# Patient Record
Sex: Female | Born: 1970
Health system: Southern US, Community
[De-identification: ages and names within clinical notes are randomized; demographics above are authoritative.]

## PROBLEM LIST (undated history)

## (undated) DIAGNOSIS — M5137 Other intervertebral disc degeneration, lumbosacral region: Secondary | ICD-10-CM

## (undated) DIAGNOSIS — R55 Syncope and collapse: Secondary | ICD-10-CM

## (undated) DIAGNOSIS — I10 Essential (primary) hypertension: Secondary | ICD-10-CM

## (undated) DIAGNOSIS — D649 Anemia, unspecified: Secondary | ICD-10-CM

## (undated) DIAGNOSIS — M549 Dorsalgia, unspecified: Secondary | ICD-10-CM

## (undated) DIAGNOSIS — M5030 Other cervical disc degeneration, unspecified cervical region: Secondary | ICD-10-CM

## (undated) DIAGNOSIS — M1990 Unspecified osteoarthritis, unspecified site: Secondary | ICD-10-CM

## (undated) DIAGNOSIS — G8929 Other chronic pain: Secondary | ICD-10-CM

## (undated) DIAGNOSIS — K219 Gastro-esophageal reflux disease without esophagitis: Secondary | ICD-10-CM

## (undated) DIAGNOSIS — N390 Urinary tract infection, site not specified: Secondary | ICD-10-CM

## (undated) DIAGNOSIS — IMO0001 Reserved for inherently not codable concepts without codable children: Secondary | ICD-10-CM

## (undated) DIAGNOSIS — Z9889 Other specified postprocedural states: Secondary | ICD-10-CM

## (undated) DIAGNOSIS — J45909 Unspecified asthma, uncomplicated: Secondary | ICD-10-CM

## (undated) DIAGNOSIS — T8859XA Other complications of anesthesia, initial encounter: Secondary | ICD-10-CM

## (undated) DIAGNOSIS — N200 Calculus of kidney: Secondary | ICD-10-CM

## (undated) DIAGNOSIS — R112 Nausea with vomiting, unspecified: Secondary | ICD-10-CM

## (undated) DIAGNOSIS — T4145XA Adverse effect of unspecified anesthetic, initial encounter: Secondary | ICD-10-CM

## (undated) DIAGNOSIS — E7800 Pure hypercholesterolemia, unspecified: Secondary | ICD-10-CM

## (undated) DIAGNOSIS — J189 Pneumonia, unspecified organism: Secondary | ICD-10-CM

## (undated) DIAGNOSIS — M51379 Other intervertebral disc degeneration, lumbosacral region without mention of lumbar back pain or lower extremity pain: Secondary | ICD-10-CM

## (undated) DIAGNOSIS — G43909 Migraine, unspecified, not intractable, without status migrainosus: Secondary | ICD-10-CM

## (undated) DIAGNOSIS — J42 Unspecified chronic bronchitis: Secondary | ICD-10-CM

## (undated) HISTORY — DX: Anemia, unspecified: D64.9

## (undated) HISTORY — PX: KNEE ARTHROSCOPY: SHX127

## (undated) HISTORY — DX: Calculus of kidney: N20.0

## (undated) HISTORY — PX: BACK SURGERY: SHX140

## (undated) HISTORY — DX: Gastro-esophageal reflux disease without esophagitis: K21.9

## (undated) HISTORY — DX: Unspecified osteoarthritis, unspecified site: M19.90

---

## 1999-02-21 HISTORY — PX: CARPAL TUNNEL RELEASE: SHX101

## 1999-02-21 HISTORY — PX: GANGLION CYST EXCISION: SHX1691

## 2008-06-22 HISTORY — PX: LUMBAR LAMINECTOMY/DECOMPRESSION MICRODISCECTOMY: SHX5026

## 2008-12-13 ENCOUNTER — Ambulatory Visit (HOSPITAL_COMMUNITY): Admission: RE | Admit: 2008-12-13 | Discharge: 2008-12-16 | Payer: Self-pay | Admitting: Specialist

## 2008-12-13 ENCOUNTER — Encounter (INDEPENDENT_AMBULATORY_CARE_PROVIDER_SITE_OTHER): Payer: Self-pay | Admitting: Specialist

## 2010-09-29 LAB — CBC
HCT: 34.4 % — ABNORMAL LOW (ref 36.0–46.0)
MCHC: 33.8 g/dL (ref 30.0–36.0)
MCV: 86.9 fL (ref 78.0–100.0)
Platelets: 306 10*3/uL (ref 150–400)
Platelets: 327 10*3/uL (ref 150–400)
RBC: 3.93 MIL/uL (ref 3.87–5.11)
RBC: 3.96 MIL/uL (ref 3.87–5.11)
WBC: 7.1 10*3/uL (ref 4.0–10.5)
WBC: 8 10*3/uL (ref 4.0–10.5)

## 2010-09-29 LAB — URINALYSIS, ROUTINE W REFLEX MICROSCOPIC
Bilirubin Urine: NEGATIVE
Glucose, UA: NEGATIVE mg/dL
Hgb urine dipstick: NEGATIVE
Protein, ur: NEGATIVE mg/dL
Urobilinogen, UA: 0.2 mg/dL (ref 0.0–1.0)

## 2010-09-29 LAB — APTT: aPTT: 30 seconds (ref 24–37)

## 2010-09-29 LAB — PROTIME-INR: INR: 1 (ref 0.00–1.49)

## 2010-09-29 LAB — BASIC METABOLIC PANEL
BUN: 8 mg/dL (ref 6–23)
Calcium: 8.5 mg/dL (ref 8.4–10.5)
Creatinine, Ser: 0.83 mg/dL (ref 0.4–1.2)
GFR calc Af Amer: >60 mL/min (ref >60)
GFR calc non Af Amer: >60 mL/min (ref >60)

## 2010-09-29 LAB — COMPREHENSIVE METABOLIC PANEL
BUN: 13 mg/dL (ref 6–23)
CO2: 29 mEq/L (ref 19–32)
Calcium: 8.8 mg/dL (ref 8.4–10.5)
Chloride: 105 mEq/L (ref 96–112)
Creatinine, Ser: 0.97 mg/dL (ref 0.4–1.2)
GFR calc Af Amer: >60 mL/min (ref >60)
GFR calc non Af Amer: >60 mL/min (ref >60)
Total Bilirubin: 0.4 mg/dL (ref 0.3–1.2)

## 2010-11-04 NOTE — Op Note (Signed)
NAMEHAILLIE, RADU               ACCOUNT NO.:  [id number]   MEDICAL RECORD NO.:  [id number]          PATIENT TYPE:  OIB   LOCATION:  1611                         FACILITY:  [facility]   PHYSICIAN:  Jene Every, M.D.    DATE OF BIRTH:  1970-08-27   DATE OF PROCEDURE:  12/13/2008  DATE OF DISCHARGE:                               OPERATIVE REPORT   PREOPERATIVE DIAGNOSES:  Herniated nucleus pulposus at L4-5 right and L5-  1 left.   POSTOPERATIVE DIAGNOSES:  Herniated nucleus pulposus at L4-5 right and  L5-1 left.   PROCEDURE PERFORMED:  Lumbar decompression L4-5 right and L5-S1 left.   ANESTHESIA:  General.   ASSISTANT:  Roma Schanz, P.A.   BRIEF HISTORY AND INDICATIONS:  A 40 year old with HNP 4-5 paracentral  to the right with foraminal disk herniation compressing the 5 root.  Disk herniation L5-S1 to the left compressing the S1 nerve root with S1  radiculopathy.  Preoperatively, she had positive tension signs on the  right, EHL weakness at 4+/5, decreased sensation L5 dermatome on the  left, positive straight leg raise, diminished repetitive plantar flexion  __________ sensation lateral aspect of the foot.  She was indicated for  decompression of 4-5 on the right and at 5 on the left.  The risks and  benefits discussed, including bleeding, infection, damage to  neurovascular structures, CSF leakage, epidural fibrosis, __________need  for fusion in the future, anesthetic complications, etc.   TECHNIQUE:  Patient in supine position after the induction of adequate  general anesthesia and 2 grams of Kefzol, she was placed prone on the  Andrews frame and all bony prominences well padded.  The lumbar region  was prepped and draped in the usual sterile fashion.  Two 18 gauge  spinal needles were utilized to localize the 4-5 and 5-1 interspace.  This was confirmed with x-ray.  An incision was made from the spinous  process of 4-S1.  Subcutaneous tissue was dissected.   Electrocautery was  utilized to achieve hemostasis.  The dorsal lumbar fascia identified and  divided in line with the skin incision first and left at 4-5.  McCullough retractor was placed.  A Penfield IV in the interlaminar  space confirming the space at 4-5.  The operating microscope was draped  and brought onto the surgical field.  First, ligamentum flavum was  detached from the cephalad edge of 5 utilizing a straight curette and  from the caudad edge of 4.  Neuro patty placed beneath the ligamentum  flavum.  Foraminotomy of 5 was performed.  Ligamentum flavum removed  from the interspace.  The 5 root was noted to be compressed in the  lateral recess.  The lateral recess was decompressed at the medial  border of the pedicle.  I gently mobilized the 5 root medially.  HNP was  noted at 4-5.  After foraminotomy of 5 was performed, annulotomy was  performed and a copious portion of disk material was removed from the  disk space with straight and upbiting pituitary and mobilized out  laterally from the foramen as well.  The disk herniation extended  down  into the foramen of 5 and this was retrieved with a micro pituitary with  a small fragment here.  This decompressed the 5 root.  Following this,  there was at least a cm of excursion of 5 root pedicle without  difficulty.  I checked beneath the thecal sac, axilla and the shoulder  of the root and foramen of 4 and down to 5 and there was no residual  compression upon the 5 root.  The disk space copiously irrigated with  antibiotic irrigation.  Dissection revealed no evidence of CSF leakage  of active bleeding.  We placed thrombin soaked Gelfoam into the defect.  Next, we turned attention to 5-1 on the right.  In a similar fashion, I  incised the dorsolumbar fascia.  The paraspinal muscles elevated from  the lamina of 5 and 1.  A McCullough retractor was placed which  confirmed the space at 5-1 with a Penfield IV in the interlaminar space   and palpation noted the sacrum.  Ligamentum flavum detached from the  cephalad edge of S1.  Utilizing straight curette, foraminotomy of S1 was  performed.  Ligamentum flavum removed from the inner space.  This  protecting the nerve root, it was significantly decompressing the  lateral recess secondary to stenosis, ligamentum flavum hypertrophy in  the disk protrusion.  Gentle mobilized medially and decompressed the  lateral recess and medial border of the pedicle.  A small focal disk  herniation was performed and an annulotomy was performed and copious  portion of disk material was removed from the disk space with straight  and upbiting pituitary.  A full diskectomy of herniated material was  performed.  I then checked the foramen of 5 and S1, widely patent.  Beneath the thecal sac, the axilla and the shoulder root with no  residual compression upon the root.  The disk space was copiously  irrigated with antibiotic irrigation.  Inspection revealed no evidence  CSF leakage of active bleeding.  McCullough retractor was removed on  both.  Irrigation, no active bleeding.  We repaired the dorsolumbar  fascia around both incisions with #1 Vicryl interrupted figure-of-eight  stitches.  The subcutaneous tissue reapproximated with 2-0 Vicryl simple  sutures.  The skin was reapproximated with 4-0 subcuticular Prolene.  The wound reinforced with Steri-Strips.  Sterile dressing applied.  Placed supine on the hospital bed, extubated without difficulty and  transported to the recovery room in satisfactory condition.   The patient tolerated the procedure well with no complications.      Jene Every, M.D.  Electronically Signed     JB/MEDQ  D:  12/31/2008  T:  01/01/2009  Job:  [id number]

## 2010-12-16 ENCOUNTER — Other Ambulatory Visit (HOSPITAL_COMMUNITY): Payer: Self-pay | Admitting: Specialist

## 2010-12-16 ENCOUNTER — Other Ambulatory Visit: Payer: Self-pay | Admitting: Specialist

## 2010-12-16 ENCOUNTER — Ambulatory Visit (HOSPITAL_COMMUNITY)
Admission: RE | Admit: 2010-12-16 | Discharge: 2010-12-16 | Disposition: A | Discharge status: Home / Self Care | Payer: PRIVATE HEALTH INSURANCE | Source: Ambulatory Visit | Attending: Specialist | Admitting: Specialist

## 2010-12-16 ENCOUNTER — Encounter (HOSPITAL_COMMUNITY): Payer: PRIVATE HEALTH INSURANCE

## 2010-12-16 DIAGNOSIS — M7500 Adhesive capsulitis of unspecified shoulder: Secondary | ICD-10-CM | POA: Insufficient documentation

## 2010-12-16 DIAGNOSIS — Z01811 Encounter for preprocedural respiratory examination: Secondary | ICD-10-CM | POA: Insufficient documentation

## 2010-12-16 DIAGNOSIS — M25819 Other specified joint disorders, unspecified shoulder: Secondary | ICD-10-CM | POA: Insufficient documentation

## 2010-12-16 DIAGNOSIS — M75101 Unspecified rotator cuff tear or rupture of right shoulder, not specified as traumatic: Secondary | ICD-10-CM

## 2010-12-16 DIAGNOSIS — Z01810 Encounter for preprocedural cardiovascular examination: Secondary | ICD-10-CM | POA: Insufficient documentation

## 2010-12-16 DIAGNOSIS — Z79899 Other long term (current) drug therapy: Secondary | ICD-10-CM | POA: Insufficient documentation

## 2010-12-16 DIAGNOSIS — X58XXXA Exposure to other specified factors, initial encounter: Secondary | ICD-10-CM | POA: Insufficient documentation

## 2010-12-16 DIAGNOSIS — Z01812 Encounter for preprocedural laboratory examination: Secondary | ICD-10-CM | POA: Insufficient documentation

## 2010-12-16 DIAGNOSIS — S43429A Sprain of unspecified rotator cuff capsule, initial encounter: Secondary | ICD-10-CM | POA: Insufficient documentation

## 2010-12-16 DIAGNOSIS — J45909 Unspecified asthma, uncomplicated: Secondary | ICD-10-CM | POA: Insufficient documentation

## 2010-12-16 LAB — CBC
HCT: 40.6 % (ref 36.0–46.0)
Hemoglobin: 13.2 g/dL (ref 12.0–15.0)
RBC: 4.86 MIL/uL (ref 3.87–5.11)
RDW: 13.6 % (ref 11.5–15.5)
WBC: 9.4 10*3/uL (ref 4.0–10.5)

## 2010-12-16 LAB — BASIC METABOLIC PANEL
BUN: 19 mg/dL (ref 6–23)
CO2: 27 mEq/L (ref 19–32)
Chloride: 99 mEq/L (ref 96–112)
GFR calc Af Amer: >60 mL/min (ref >60)
Potassium: 4.2 mEq/L (ref 3.5–5.1)

## 2010-12-21 HISTORY — PX: SHOULDER ARTHROSCOPY W/ ROTATOR CUFF REPAIR: SHX2400

## 2010-12-25 ENCOUNTER — Ambulatory Visit (HOSPITAL_COMMUNITY)
Admission: RE | Admit: 2010-12-25 | Discharge: 2010-12-26 | Disposition: A | Discharge status: Home / Self Care | Payer: PRIVATE HEALTH INSURANCE | Source: Ambulatory Visit | Attending: Specialist | Admitting: Specialist

## 2010-12-25 DIAGNOSIS — S43429A Sprain of unspecified rotator cuff capsule, initial encounter: Secondary | ICD-10-CM | POA: Insufficient documentation

## 2010-12-25 DIAGNOSIS — M25819 Other specified joint disorders, unspecified shoulder: Secondary | ICD-10-CM | POA: Insufficient documentation

## 2010-12-25 DIAGNOSIS — M659 Unspecified synovitis and tenosynovitis, unspecified site: Secondary | ICD-10-CM | POA: Insufficient documentation

## 2010-12-25 DIAGNOSIS — F329 Major depressive disorder, single episode, unspecified: Secondary | ICD-10-CM | POA: Insufficient documentation

## 2010-12-25 DIAGNOSIS — F3289 Other specified depressive episodes: Secondary | ICD-10-CM | POA: Insufficient documentation

## 2010-12-25 DIAGNOSIS — M7500 Adhesive capsulitis of unspecified shoulder: Secondary | ICD-10-CM | POA: Insufficient documentation

## 2010-12-25 DIAGNOSIS — Z01812 Encounter for preprocedural laboratory examination: Secondary | ICD-10-CM | POA: Insufficient documentation

## 2010-12-25 DIAGNOSIS — Z87891 Personal history of nicotine dependence: Secondary | ICD-10-CM | POA: Insufficient documentation

## 2010-12-25 DIAGNOSIS — J45909 Unspecified asthma, uncomplicated: Secondary | ICD-10-CM | POA: Insufficient documentation

## 2010-12-25 DIAGNOSIS — X58XXXA Exposure to other specified factors, initial encounter: Secondary | ICD-10-CM | POA: Insufficient documentation

## 2010-12-25 LAB — HCG, SERUM, QUALITATIVE: Preg, Serum: NEGATIVE

## 2010-12-29 NOTE — Op Note (Signed)
Jordan Kim, Jordan Kim               ACCOUNT NO.:  [id number]  MEDICAL RECORD NO.:  [id number]  LOCATION:  DAYL                         FACILITY:  [facility]  PHYSICIAN:  Jene Every, M.D.    DATE OF BIRTH:  [birth year]  DATE OF PROCEDURE: DATE OF DISCHARGE:                              OPERATIVE REPORT   PREOPERATIVE DIAGNOSIS:  Rotator cuff tear, impingement syndrome, adhesive capsulitis, right shoulder.  POSTOPERATIVE DIAGNOSIS:  Rotator cuff tear, impingement syndrome, adhesive capsulitis, right shoulder.  PROCEDURE PERFORMED: 1. Exam and manipulation under anesthesia. 2. Right shoulder arthroscopy. 3. Debridement of bicipital tendonitis and rotator cuff and labrum. 4. Subacromial decompression, bursectomy, mini open rotator cuff     repair.  ANESTHESIA:  General.  ASSISTANT:  Roma Schanz, P.A.  BRIEF HISTORY:  This 40 year old with locking shoulder pain, refractory conservative treatment including corticosteroid injections, activity modifications and restricted range of motion was indicated for exam and manipulation under anesthesia, shoulder arthroscopy, debridement and probable mini open rotator cuff repair due to the patient's noted over 50% tear in the rotator cuff foot plate.  This is to be evaluated and repaired if torn.  Risks and benefits discussed including bleeding, infection, no change in symptoms, worsening symptoms, need for repeat debridement, DVT, PE, anesthetic complications, etc.  TECHNIQUE:  Placed supine in beach-chair position.  After induction of adequate anesthesia, 2 g of Kefzol, the right shoulder was examined under anesthesia.  The patient actually had good internal and external rotation.  Abduction was okay as well.  Some slight limited forward flexion which was improved.  Comparison to the contralateral side, she may have lacked 10 to 15 degrees of forward flexion and abduction. Following this gentle manipulative procedure, we restored  full range. Again, minimal adhesive capsulitis noted on the exam as opposed to the office exam.  Next, the right shoulder and upper extremity was prepped and draped in the usual sterile fashion.  A surgical marker was utilized to delineate acromiocoracoid in the [facility] joint.  We made a small marker of the anterolateral aspect of the acromion for a possible rotator cuff repair, mini open.  With the arm in 70:30 position and gentle traction, we advanced arthroscopic camera into the joint, penetrating it atraumatically in line with the coracoid.  Irrigant was utilized to insufflate the joint.  Inspection revealed some fraying of the biceps tendon near its notch and lateral to that, the supraspinatus tear of rotator cuff.  I fashioned anterior portal with a #11 blade after localization with an 18-gauge needle just beneath the biceps tendon between the coracoid and the anterolateral aspect of the acromion closer to lateral.  We then advanced the cannula, penetrating atraumatically. I introduced a shaver and debrided the rotator cuff and the region of the biceps, fraying which was minimal as well as a small anterior, superior labral fraying which was shaved as well.  Good chondral surfaces were noted.  I probed the biceps tendon, there was no subluxing of the biceps tendon noted.  Subscapularis slightly frayed.  Next, I placed an 18-gauge needle through the anterolateral aspect of the acromion to the rotator cuff in the area of the tear and this threaded Prolene, Vicryl.  Next, we redirected camera in subacromial space and made a small anterolateral incision for a portal and placed in the anterolateral portal, triangulating the subacromial space, insufflating with irrigation.  65 mmHg reduced without the case.  Some difficulty in visualization, subacromial space.  We had to add epinephrine to the bags to the arthroscopic irrigant.  Hypertrophic bursa and synovitis was noted.  We introduced a  shaver anterolaterally and performed full bursectomy.  I saw the area where the tear was attenuated and turned the anterolateral aspect of the supraspinatus, very large tear in the rotator cuff.  I released CA ligament with an Arthur wand.  I shaved the anterolateral aspect of the acromion and then made a small incision over the anterolateral aspect of the acromion about 2 cm in length.  Subcutaneous tissue was dissected. Electrocautery was utilized to achieve hemostasis, she had a fairly adipose subcutaneous tissue.  I found the raphe between the anterolateral head, made a small division there about a centimeter and half and was slightly elevated in the anterolateral and the anterior medial aspect of the acromion, preserving the attachment of the deltoid. Entered the subacromial space, removed any residual bursa, digitally lysed adhesions.  The area of the tear was noted.  I made a small incision just lateral to the bicipital groove where the supraspinatus footprint was.  It is the nonviable tissue there, we incised this, debrided it and curetted at its base which was detached from the greater tuberosity.  Therefore, placed 2 Mitek suture, impacted into the greater tuberosity, threaded up through the supraspinatus and repaired it with a surgeon's knot.  Full coverage was noted.  This was more linear tearing. No full avulsion.  Following this, I inspected the remainder of the cuff was unremarkable.  Therefore, copiously irrigated and repaired the raphe with #1 Vicryl interrupted figure-of-eight sutures over top through the acromion, subcu with 2-0 Vicryl simple sutures multiple layers, skin reapproximated with 4-0 subcuticular Prolene.  Wound, we enforced Steri- Strips, sterile dressing applied, placed an abduction pillow and extubated without difficulty and transported to recovery room in satisfactory condition.  The patient tolerated the procedure well.  No complications.   Minimal blood loss.     Jene Every, M.D.     [name]  D:  12/25/2010  T:  12/25/2010  Job:  [id number]  Electronically Signed by Jene Every M.D. on 12/29/2010 03:49:45 PM

## 2014-01-24 ENCOUNTER — Ambulatory Visit (INDEPENDENT_AMBULATORY_CARE_PROVIDER_SITE_OTHER): Payer: 59

## 2014-01-24 VITALS — BP 122/68 | HR 69 | Resp 18

## 2014-01-24 DIAGNOSIS — M5416 Radiculopathy, lumbar region: Secondary | ICD-10-CM

## 2014-01-24 DIAGNOSIS — M778 Other enthesopathies, not elsewhere classified: Secondary | ICD-10-CM

## 2014-01-24 DIAGNOSIS — M7730 Calcaneal spur, unspecified foot: Secondary | ICD-10-CM

## 2014-01-24 DIAGNOSIS — G5760 Lesion of plantar nerve, unspecified lower limb: Secondary | ICD-10-CM

## 2014-01-24 DIAGNOSIS — M779 Enthesopathy, unspecified: Secondary | ICD-10-CM

## 2014-01-24 DIAGNOSIS — M722 Plantar fascial fibromatosis: Secondary | ICD-10-CM

## 2014-01-24 DIAGNOSIS — M7750 Other enthesopathy of unspecified foot: Secondary | ICD-10-CM

## 2014-01-24 DIAGNOSIS — R52 Pain, unspecified: Secondary | ICD-10-CM

## 2014-01-24 DIAGNOSIS — G5763 Lesion of plantar nerve, bilateral lower limbs: Secondary | ICD-10-CM

## 2014-01-24 MED ORDER — MELOXICAM 15 MG PO TABS: 15.0000 mg | ORAL_TABLET | Freq: Every day | ORAL | Status: DC

## 2014-01-24 NOTE — Patient Instructions (Signed)
Morton's Neuroma in Sports  (Interdigital Plantar Neuroma) Morton's neuroma is a condition of the nervous system that results in pain or loss of feeling in the toes. The disease is caused by the bones of the foot squeezing the nerve that runs between two toes (interdigital nerve). The third and fourth toes are most likely to be affected by this disease. SYMPTOMS   Tingling, numbness, burning, or electric shocks in the front of the foot, often involving the third and fourth toes, although it may involve any other pair of toes.  Pain and tenderness in the front of the foot, that gets worse when walking.  Pain that gets worse when pressure is applied to the foot (wearing shoes).  Severe pain in the front of the foot, when standing on the front of the foot (on tiptoes), such as with running, jumping, pivoting, or dancing. CAUSES  Morton's neuroma is caused by swelling of the nerve between two toes. This swelling causes the nerve to be pinched between the bones of the foot. RISK INCREASES WITH:  Recurring foot or ankle injuries.  Poor fitting or worn shoes, with minimal padding and shock absorbers.  Loose ligaments of the foot, causing thickening of the nerve.  Poor foot strength and flexibility. PREVENTION  Warm up and stretch properly before activity.  Maintain physical fitness:  Foot and ankle flexibility.  Muscle strength and endurance.  Cardiovascular fitness.  Wear properly fitted and padded shoes.  Wear arch supports (orthotics), when needed. PROGNOSIS  If treated properly, Morton's neuroma can usually be cured with non-surgical treatment. For certain cases, surgery may be needed. RELATED COMPLICATIONS  Permanent numbness and pain in the foot.  Inability to participate in athletics, because of pain. TREATMENT Treatment first involves stopping any activities that make the symptoms worse. The use of ice and medicine will help reduce pain and inflammation. Wearing shoes  with a wide toe box, and an orthotic arch support or metatarsal bar, may also reduce pain. Your caregiver may give you a corticosteroid injection, to further reduce inflammation. If non-surgical treatment is unsuccessful, surgery may be needed. Surgery to fix Morton's neuroma is often performed as an outpatient procedure, meaning you can go home the same day as the surgery. The procedure involves removing the source of pressure on the nerve. If it is necessary to remove the nerve, you can expect persistent numbness. MEDICATION  If pain medicine is needed, nonsteroidal anti-inflammatory medicines (aspirin and ibuprofen), or other minor pain relievers (acetaminophen), are often advised.  Do not take pain medicine for 7 days before surgery.  Prescription pain relievers are usually prescribed only after surgery. Use only as directed and only as much as you need.  Corticosteroid injections are used in extreme cases, to reduce inflammation. These injections should be done only if necessary, because they may be given only a limited number of times. HEAT AND COLD  Cold treatment (icing) should be applied for 10 to 15 minutes every 2 to 3 hours for inflammation and pain, and immediately after activity that aggravates your symptoms. Use ice packs or an ice massage.  Heat treatment may be used before performing stretching and strengthening activities prescribed by your caregiver, physical therapist, or athletic trainer. Use a heat pack or a warm water soak. SEEK MEDICAL CARE IF:   Symptoms get worse or do not improve in 2 weeks, despite treatment.  After surgery you develop increasing pain, swelling, redness, increased warmth, bleeding, drainage of fluids, or fever.  New, unexplained symptoms develop. (  Drugs used in treatment may produce side effects.) Document Released: 04/15/2005 Document Revised: 08/31/2011 Document Reviewed: 09/20/2008 [facility] Patient Information 2015 [place], [place]. This  information is not intended to replace advice given to you by your health care provider. Make sure you discuss any questions you have with your health care provider.    Plantar Fasciitis Plantar fasciitis is a common condition that causes foot pain. It is soreness (inflammation) of the band of tough fibrous tissue on the bottom of the foot that runs from the heel bone (calcaneus) to the ball of the foot. The cause of this soreness may be from excessive standing, poor fitting shoes, running on hard surfaces, being overweight, having an abnormal walk, or overuse (this is common in runners) of the painful foot or feet. It is also common in aerobic exercise dancers and ballet dancers. SYMPTOMS  Most people with plantar fasciitis complain of:  Severe pain in the morning on the bottom of their foot especially when taking the first steps out of bed. This pain recedes after a few minutes of walking.  Severe pain is experienced also during walking following a long period of inactivity.  Pain is worse when walking barefoot or up stairs DIAGNOSIS   Your caregiver will diagnose this condition by examining and feeling your foot.  Special tests such as X-rays of your foot, are usually not needed. PREVENTION   Consult a sports medicine professional before beginning a new exercise program.  Walking programs offer a good workout. With walking there is a lower chance of overuse injuries common to runners. There is less impact and less jarring of the joints.  Begin all new exercise programs slowly. If problems or pain develop, decrease the amount of time or distance until you are at a comfortable level.  Wear good shoes and replace them regularly.  Stretch your foot and the heel cords at the back of the ankle (Achilles tendon) both before and after exercise.  Run or exercise on even surfaces that are not hard. For example, asphalt is better than pavement.  Do not run barefoot on hard surfaces.  If  using a treadmill, vary the incline.  Do not continue to workout if you have foot or joint problems. Seek professional help if they do not improve. HOME CARE INSTRUCTIONS   Avoid activities that cause you pain until you recover.  Use ice or cold packs on the problem or painful areas after working out.  Only take over-the-counter or prescription medicines for pain, discomfort, or fever as directed by your caregiver.  Soft shoe inserts or athletic shoes with air or gel sole cushions may be helpful.  If problems continue or become more severe, consult a sports medicine caregiver or your own health care provider. Cortisone is a potent anti-inflammatory medication that may be injected into the painful area. You can discuss this treatment with your caregiver. MAKE SURE YOU:   Understand these instructions.  Will watch your condition.  Will get help right away if you are not doing well or get worse. Document Released: 03/03/2001 Document Revised: 08/31/2011 Document Reviewed: 05/02/2008 [facility] ChildrenExitCare Patient Information 2015 [place], [place]. This information is not intended to replace advice given to you by your health care provider. Make sure you discuss any questions you have with your health care provider.   ICE INSTRUCTIONS  Apply ice or cold pack to the affected area at least 3 times a day for 10-15 minutes each time.  You should also use ice after prolonged activity  or vigorous exercise.  Do not apply ice longer than 20 minutes at one time.  Always keep a cloth between your skin and the ice pack to prevent burns.  Being consistent and following these instructions will help control your symptoms.  We suggest you purchase a gel ice pack because they are reusable and do bit leak.  Some of them are designed to wrap around the area.  Use the method that works best for you.  Here are some other suggestions for icing.   Use a frozen bag of peas or corn-inexpensive and molds well to your body,  usually stays frozen for 10 to 20 minutes.  Wet a towel with cold water and squeeze out the excess until it's damp.  Place in a bag in the freezer for 20 minutes. Then remove and use.

## 2014-01-24 NOTE — Progress Notes (Signed)
Subjective:    Patient ID: Jordan Kim, female    DOB: [birth year], 43 y.o.   MRN: [id number]  HPI I AM HAVING SOME HEEL PAIN  ON BOTH FEETAND HURTS ON THE RIGHT FOOT ON THE SIDE AND BURNS AND THROBS AND I AM ON MY FEET AT LEAST 7 HOURS A DAY AND NUMBNESS AND TINGLING AND THERE IS SOME SWELLING AND IS SORE AND TENDER    Review of Systems  Constitutional: Positive for activity change, fatigue and unexpected weight change.  HENT: Positive for ear pain.   Eyes: Positive for visual disturbance.  Respiratory: Positive for wheezing.   Cardiovascular: Positive for leg swelling.  Musculoskeletal: Positive for back pain and gait problem.       JOINT PAIN AND MUSCLE PAIN  Neurological: Positive for weakness and numbness.  All other systems reviewed and are negative.      Objective:   Physical Exam 43 year old white female well-developed well-nourished oriented x3 presents at this time with multiple areas of pain or concern. Most significant is pain on the inferior heel area bilateral painful on first up in the morning or getting up 5. Her breasts is been going on for several months now. With shoe wear has tried different shoe flow or no success. Patient also has pain in the forefoot with burning and stinging pain especially the second and third toe area second interspaces most significantly tender however patient also has a history of lumbar radiculopathies had previous lumbar surgery effusions which is left her with some abnormal sensations in particular lumbar radiculopathy chronic is noted. The third issue is along the lateral midfoot points to Lisfranc's fourth met and fifth metatarsal base and cuboid articulation site aggravated with certain shoes after prolonged periods walking activities. This may be compensatory gait change posse peroneal insertion tendinitis  Lower extremity objective findings as follows vascular status is intact with pedal pulses palpable DP postal for PT +2/4 capillary  refill time 3 seconds epicritic and proprioceptive sensations appear to be intact however there may be some hypersensitivity and some areas consistent with lumbar radiculopathy. There is pain on direct lateral compression second interspaces bilateral which may be consistent with early Morton's neuroma as well. Orthopedic biomechanical exam reveals rectus foot type mild digital contractures noted x-rays no well-developed inferior calcaneal spur and fascial thickening there is pain and tenderness on palpation medial band of the plantar fascia bilateral there is some tenderness along the lateral fifth metatarsal base and cuboid slight x-rays reveal no fractures no cyst or tumors or success or navicular bilateral right more so than left patient has had navicular issues in the past however not been approved patient does have notable it I am angle greater than 12 hallux abductus 20 sesamoid position 3-4 bilateral also noted on x-rays at this time. There no open wounds ulcerations no signs of infection       Assessment & Plan:  Assessment this time #1 plantar fasciitis/heel spur syndrome bilateral fascial strapping applied to both feet prescription for [facility] is issued recommend a good stable athletic or walking shoe currently wearing Brooks. Assessment #2 is suspect Morton's neuroma versus lumbar radiculopathy will take NSAIDs in maintain a wide accommodative shoe in the interim and reassess the neuroma versus radiculopathy tissue the next 2-3 weeks. Patient has been on Lyrica in the past could not take this. It is likely to have similar response to gabapentin. Do compensatory gait changes may be developing some peroneal tendinitis or Lisfranc's mid tarsus capsulitis for  fifth metatarsal base and cuboid due to promontory changes. Not this time fascial strapping applied and will help with promontory changes reevaluate in 2 weeks may be a strong candidate for functional orthoses in the future decision process we  carried out based on progress and changes and findings  Harriet Masson DPM

## 2014-01-29 ENCOUNTER — Telehealth: Payer: Self-pay

## 2014-01-29 NOTE — Telephone Encounter (Signed)
Pt called in to return a call - she didn't know how called her but she saw Ralene CorkSikora last week

## 2014-01-30 NOTE — Telephone Encounter (Signed)
I CALLED AND THE PATIENT WILL CALL BACK BEFORE 5 PM TODAY. LISA

## 2014-01-30 NOTE — Telephone Encounter (Signed)
PATIENT CALLED AND STATED THAT HER HEEL WAS KILLING HER AND NEEDED TO BE SEEN BEFORE NEXT Wednesday THE 19TH TOLD PATIENT I WOULD GET WITH MELODY TO MAKE AN APPT.Jordan Kim

## 2014-01-30 NOTE — Telephone Encounter (Signed)
Spoke with patient and scheduled her an appoint for the next morning Wednesday 01/31/14 at 8:30am with Dr Josue HectorSikora  mparry CNA

## 2014-01-31 ENCOUNTER — Ambulatory Visit (INDEPENDENT_AMBULATORY_CARE_PROVIDER_SITE_OTHER): Payer: Self-pay

## 2014-01-31 VITALS — BP 115/59 | HR 84 | Resp 18

## 2014-01-31 DIAGNOSIS — M722 Plantar fascial fibromatosis: Secondary | ICD-10-CM

## 2014-01-31 DIAGNOSIS — G5763 Lesion of plantar nerve, bilateral lower limbs: Secondary | ICD-10-CM

## 2014-01-31 DIAGNOSIS — G5760 Lesion of plantar nerve, unspecified lower limb: Secondary | ICD-10-CM

## 2014-01-31 DIAGNOSIS — M779 Enthesopathy, unspecified: Secondary | ICD-10-CM

## 2014-01-31 DIAGNOSIS — M7730 Calcaneal spur, unspecified foot: Secondary | ICD-10-CM

## 2014-01-31 DIAGNOSIS — R52 Pain, unspecified: Secondary | ICD-10-CM

## 2014-01-31 DIAGNOSIS — M7750 Other enthesopathy of unspecified foot: Secondary | ICD-10-CM

## 2014-01-31 DIAGNOSIS — M778 Other enthesopathies, not elsewhere classified: Secondary | ICD-10-CM

## 2014-01-31 MED ORDER — TRIAMCINOLONE ACETONIDE 10 MG/ML IJ SUSP: 10.0000 mg | Freq: Once | INTRAMUSCULAR | Status: DC

## 2014-01-31 NOTE — Patient Instructions (Signed)

## 2014-01-31 NOTE — Progress Notes (Signed)
   Subjective:    Patient ID: Jordan Kim, female    DOB: 06/12/1971, 43 y.o.   MRN: [id number]  HPI MY HEELS ARE HURTING BUT THE RIGHT IS THE WORSE AND HURTS ON THE SIDE OF MY FOOT    Review of Systems no new findings or systemic changes noted     Objective:   Physical Exam Lower extremity objective findings as follows pedal pulses are palpable epicritic and proprioceptive sensations intact patient had strapping in both feet while the taping was on Rocephin improvement scars the heel pain still has some midfoot pain points to Lisfranc for fifth metatarsal base and cuboid on the right is also associated with promontory changes and abduction of the foot and the taping help pain in most instances although not complete relief had improvement based on the fact patient is a candidate for orthoses a there is some is hurting when orthotics are covered under her plan on However her husband had orthotics a year ago and covered by her insurance are this time we'll try an OTC orthotic power step orthotics are dispensed at this time with break in wearing instructions a size 10 10/2 orthotic is dispensed with instructions for use. Patient also is having significant pain on palpation and is having some slight chance of medication alone tolerating otherwise her neck she some loose stools bleeding no heartburn or other symptomology noted patient also can be this time for steroid injection.       Assessment & Plan:  Assessment plantar fasciitis/heel spur syndrome bilateral also capsulitis Lisfranc's joint to the lateral column fourth metatarsal base and cuboid right both respond to fascial strapping if orthoses with beneficial power step orthotics dispensed injections to both heels, and Kenalog 20 no Xylocaine plain infiltrated in the inferior interval medial band of the plantar fascial insertion site. This, the injections well advised to apply ice to the area instructions for about use in break in are given if  these don't benefit or improve symptomology may be K. for prescription custom orthoses in the future based on progress recheck in one to 2 months for adjustments if needed  Alvan Dameichard Darly Massi DPM

## 2014-02-07 ENCOUNTER — Ambulatory Visit: Payer: 59

## 2014-02-14 ENCOUNTER — Ambulatory Visit (INDEPENDENT_AMBULATORY_CARE_PROVIDER_SITE_OTHER): Payer: 59

## 2014-02-14 VITALS — BP 129/75 | HR 61 | Resp 18

## 2014-02-14 DIAGNOSIS — M7750 Other enthesopathy of unspecified foot: Secondary | ICD-10-CM

## 2014-02-14 DIAGNOSIS — M7730 Calcaneal spur, unspecified foot: Secondary | ICD-10-CM

## 2014-02-14 DIAGNOSIS — M779 Enthesopathy, unspecified: Secondary | ICD-10-CM

## 2014-02-14 DIAGNOSIS — M778 Other enthesopathies, not elsewhere classified: Secondary | ICD-10-CM

## 2014-02-14 DIAGNOSIS — M722 Plantar fascial fibromatosis: Secondary | ICD-10-CM

## 2014-02-14 NOTE — Progress Notes (Signed)
   Subjective:    Patient ID: Jordan Kim, female    DOB: 04/17/1971, 43 y.o.   MRN: [id number]  HPI I AM DOING MUCH BETTER THAN I WAS BUT YESTERDAY I COULD FEEL IT IN MY RIGHT HEEL AND ON THE SIDE AND I HAVE SWITCHED SHOES    Review of Systems no new findings or systemic changes noted     Objective:   Physical Exam Neurovascular status is intact pedal pulses are palpable the heel pain is greatly improved still having some slight tenderness along the fifth metatarsal base right foot more so than left especially when she works 7-14 hour on her feet she long shifts. Although improved not completely resolved at this point the orthotics fit and contour well. No wounds ulcerations no secondary infections tenderness on palpation and inversion eversion mid tarsus joint and Lisfranc joint particular right more so than left      Assessment & Plan:  Assessment good progress the orthotics seem to be working she's going on for less than 2 weeks this time suggested reduce to 2 months for reevaluation to adjust fully to the orthotics let him improve her symptoms patient by taking her from 1-3 months for symptoms to resolve completely if not resolved in 2 months followup for additional orthotic adjustments shoe store and orthoses at all her shoes and including her Sh[employer] or new balance work shoes she is wearing inserts catchers that she work well well adapted the orthotic at this time. Patient does have a forefoot varus in the orthotics fit and contour as well as should be stabilizing the arch preventing the promontory changes and reducing stress and the fascia as well as Lisfranc joints over time  Alvan Dame DPM

## 2014-02-14 NOTE — Patient Instructions (Signed)

## 2014-03-23 ENCOUNTER — Other Ambulatory Visit: Payer: Self-pay

## 2014-03-25 ENCOUNTER — Emergency Department (HOSPITAL_COMMUNITY)
Admission: EM | Admit: 2014-03-25 | Discharge: 2014-03-25 | Disposition: A | Discharge status: Home / Self Care | Payer: 59 | Attending: Emergency Medicine | Admitting: Emergency Medicine

## 2014-03-25 ENCOUNTER — Encounter (HOSPITAL_COMMUNITY): Payer: Self-pay | Admitting: Emergency Medicine

## 2014-03-25 DIAGNOSIS — M5416 Radiculopathy, lumbar region: Secondary | ICD-10-CM | POA: Diagnosis not present

## 2014-03-25 DIAGNOSIS — Z791 Long term (current) use of non-steroidal anti-inflammatories (NSAID): Secondary | ICD-10-CM | POA: Diagnosis not present

## 2014-03-25 DIAGNOSIS — K219 Gastro-esophageal reflux disease without esophagitis: Secondary | ICD-10-CM | POA: Diagnosis not present

## 2014-03-25 DIAGNOSIS — Z79899 Other long term (current) drug therapy: Secondary | ICD-10-CM | POA: Insufficient documentation

## 2014-03-25 DIAGNOSIS — Z862 Personal history of diseases of the blood and blood-forming organs and certain disorders involving the immune mechanism: Secondary | ICD-10-CM | POA: Diagnosis not present

## 2014-03-25 DIAGNOSIS — Z87442 Personal history of urinary calculi: Secondary | ICD-10-CM | POA: Insufficient documentation

## 2014-03-25 DIAGNOSIS — M1990 Unspecified osteoarthritis, unspecified site: Secondary | ICD-10-CM | POA: Insufficient documentation

## 2014-03-25 DIAGNOSIS — M545 Low back pain: Secondary | ICD-10-CM | POA: Diagnosis present

## 2014-03-25 MED ORDER — OXYCODONE-ACETAMINOPHEN 5-325 MG PO TABS: 1.0000 | ORAL_TABLET | Freq: Four times a day (QID) | ORAL | Status: DC | PRN

## 2014-03-25 MED ORDER — PREDNISONE 20 MG PO TABS: ORAL_TABLET | ORAL | Status: DC

## 2014-03-25 MED ORDER — DIAZEPAM 5 MG PO TABS: ORAL_TABLET | ORAL | Status: DC

## 2014-03-25 MED ORDER — HYDROMORPHONE HCL 2 MG/ML IJ SOLN
2.0000 mg | Freq: Once | INTRAMUSCULAR | Status: AC
  Administered 2014-03-25: 2 mg via INTRAMUSCULAR
  Filled 2014-03-25: qty 1

## 2014-03-25 MED ORDER — DIAZEPAM 5 MG PO TABS
5.0000 mg | ORAL_TABLET | Freq: Once | ORAL | Status: AC
  Administered 2014-03-25: 5 mg via ORAL
  Filled 2014-03-25: qty 1

## 2014-03-25 NOTE — ED Notes (Signed)
Pt reports hx of back pain, back surgery in 2010 with Dr Shelle IronBeane. Pt reports she has had a "catch in her back" for two weeks. On Wednesday pt was working mopping and taking trash out and started having increased back pain. Pain has increased throughout weekend. Pain 5/10 at present. Pt ambulatory.

## 2014-03-25 NOTE — Discharge Instructions (Signed)
Please read and follow all provided instructions.  Your diagnoses today include:  1. Lumbar radiculopathy     Tests performed today include:  Vital signs - see below for your results today  Medications prescribed:  Use home pain medications as prescribed.   Take any prescribed medications only as directed.  Home care instructions:   Follow any educational materials contained in this packet  Please rest, use ice or heat on your back for the next several days  Do not lift, push, pull anything more than 10 pounds for the next week  Follow-up instructions: Please follow-up with your orthopedic provider in the next 1 week for further evaluation of your symptoms.   Return instructions:  SEEK IMMEDIATE MEDICAL ATTENTION IF YOU HAVE:  New numbness, tingling, weakness, or problem with the use of your arms or legs  Severe back pain not relieved with medications  Loss control of your bowels or bladder  Increasing pain in any areas of the body (such as chest or abdominal pain)  Shortness of breath, dizziness, or fainting.   Worsening nausea (feeling sick to your stomach), vomiting, fever, or sweats  Any other emergent concerns regarding your health   Additional Information:  Your vital signs today were: BP 117/78   Pulse 94   Temp(Src) 97.6 F (36.4 C) (Oral)   Resp 16   SpO2 98% If your blood pressure (BP) was elevated above 135/85 this visit, please have this repeated by your doctor within one month. --------------

## 2014-03-25 NOTE — ED Provider Notes (Signed)
CSN: [id number]     Arrival date & time 03/25/14  1011 History   First MD Initiated Contact with Patient 03/25/14 1100     Chief Complaint  Patient presents with  . Back Pain     (Consider location/radiation/quality/duration/timing/severity/associated sxs/prior Treatment) HPI Comments: Patient with history of herniated disc, status post surgery by Dr. Shelle Iron in 11/2008 -- presents with several weeks of gradually worsening right lower back pain that over the past few days has become severe in her right lower back with radiation into her right leg. Pain was made worse after mopping and taking out garbage. She has noted some mild weakness in her right leg. Pain is sharp and stabbing. Patient denies warning symptoms of back pain including: fecal incontinence, urinary retention or overflow incontinence, night sweats, waking from sleep with back pain, unexplained fevers or weight loss, h/o cancer, IVDU, recent trauma. She called her orthopedist's after hours number and was sent to ED. Patient has expectation of seeing her orthopedist who is on-call today. Symptoms have been gradually worsening over the past 4 days.     Patient is a 43 y.o. female presenting with back pain. The history is provided by the patient.  Back Pain Associated symptoms: weakness   Associated symptoms: no dysuria, no fever, no numbness and no pelvic pain     Past Medical History  Diagnosis Date  . Arthritis   . Kidney stones   . GERD (gastroesophageal reflux disease)   . Anemia    Past Surgical History  Procedure Laterality Date  . Shoulder surgery      RIGHT  . Back surgery    . Finger surgery    . Carpel tunnel surgery    . Knee surgery      BOTH  . Cesarean section     History reviewed. No pertinent family history. History  Substance Use Topics  . Smoking status: Never Smoker   . Smokeless tobacco: Never Used  . Alcohol Use: No   OB History   Grav Para Term Preterm Abortions TAB SAB Ect Mult Living               Review of Systems  Constitutional: Negative for fever and unexpected weight change.  Gastrointestinal: Negative for constipation.       Negative for fecal incontinence.   Genitourinary: Negative for dysuria, hematuria, flank pain, vaginal bleeding, vaginal discharge and pelvic pain.       Negative for urinary incontinence or retention.  Musculoskeletal: Positive for back pain.  Neurological: Positive for weakness. Negative for numbness.       Denies saddle paresthesias.      Allergies  Review of patient's allergies indicates no known allergies.  Home Medications   Prior to Admission medications   Medication Sig Start Date End Date Taking? Authorizing Provider  meloxicam (MOBIC) 15 MG tablet Take 1 tablet (15 mg total) by mouth daily. 01/24/14   Alvan Dame, DPM  omeprazole (PRILOSEC) 40 MG capsule Take 40 mg by mouth daily.    Historical Provider, MD   BP 117/78  Pulse 94  Temp(Src) 97.6 F (36.4 C) (Oral)  Resp 16  SpO2 98%  Physical Exam  Nursing note and vitals reviewed. Constitutional: She appears well-developed and well-nourished.  HENT:  Head: Normocephalic and atraumatic.  Eyes: Conjunctivae are normal.  Neck: Normal range of motion. Neck supple.  Pulmonary/Chest: Effort normal.  Abdominal: Soft. There is no tenderness. There is no CVA tenderness.  Musculoskeletal: Normal range of motion.  Cervical back: Normal.       Thoracic back: Normal.       Lumbar back: She exhibits tenderness. She exhibits normal range of motion and no bony tenderness.       Back:  No step-off noted with palpation of spine.   Neurological: She is alert. She has normal strength and normal reflexes. No sensory deficit.  5/5 strength in entire lower extremities bilaterally. No sensation deficit. No foot drop with ambulation.   Skin: Skin is warm and dry. No rash noted.  Psychiatric: She has a normal mood and affect.    ED Course  Procedures (including critical care  time) Labs Review Labs Reviewed - No data to display  Imaging Review No results found.   EKG Interpretation None      Patient seen and examined. Medications offered and she refuses. She wants her orthopedic doctor to see her and states she was told to come to ED to be seen by Dr. Shelle IronBeane.   I have spoken with Dr. Criss AlvineGoldston who has seen patient. He will speak with Dr. Shelle IronBeane regarding patient.    Vital signs reviewed and are as follows: Filed Vitals:   03/25/14 1028  BP: 117/78  Pulse: 94  Temp: 97.6 F (36.4 C)  Resp: 16    1:02 PM Dr. Criss AlvineGoldston has spoken with Dr. Shelle IronBeane. They will order outpatient MRI. Patient agrees to get IM meds here.   No red flag s/s of low back pain. Patient was counseled on back pain precautions and told to do activity as tolerated but do not lift, push, or pull heavy objects more than 10 pounds for the next week.  Patient counseled to use ice or heat on back for no longer than 15 minutes every hour.   The patient verbalizes understanding and agrees with the plan.  MDM   Final diagnoses:  Lumbar radiculopathy   Patient with back pain. Contact made with patient's ortho physician who will schedule outpatient MRI. No neurological deficits. Patient is ambulatory. No warning symptoms of back pain including: fecal incontinence, urinary retention or overflow incontinence, night sweats, waking from sleep with back pain, unexplained fevers or weight loss, h/o cancer, IVDU, recent trauma. No concern for cauda equina, epidural abscess, or other serious cause of back pain. Conservative measures such as rest, ice/heat and pain medicine indicated with PCP follow-up if no improvement with conservative management.      Renne CriglerJoshua Llana Deshazo, PA-C 03/25/14 1304

## 2014-03-25 NOTE — ED Provider Notes (Signed)
Medical screening examination/treatment/procedure(s) were conducted as a shared visit with non-physician practitioner(s) and myself.  I personally evaluated the patient during the encounter.   EKG Interpretation None       Patient demanding consult from Dr. Shelle IronBeane. I discussed her case over the phone with him, he will order outpatient MRI and see her as outpatient. Has sciatic symptoms but no concern for spinal cord emergency. No B/B incontinence or focal weakness. Pain control, dc home.  Audree CamelScott T Bunny Kleist, MD 03/25/14 [phone]

## 2014-03-26 ENCOUNTER — Telehealth: Payer: Self-pay | Admitting: *Deleted

## 2014-03-26 ENCOUNTER — Other Ambulatory Visit: Payer: Self-pay

## 2014-03-26 NOTE — Telephone Encounter (Signed)
I need to get a refill of my Meloxicam.  Pharmacy is [facility] [place].

## 2014-03-26 NOTE — Telephone Encounter (Signed)
Okay to order meloxicam 15 mg dispense #30, 1 by mouth p.c. daily. Allow for 2 additional refills  Alvan Dameichard Genifer Lazenby DPM

## 2014-03-27 ENCOUNTER — Telehealth: Payer: Self-pay

## 2014-03-27 ENCOUNTER — Other Ambulatory Visit: Payer: Self-pay | Admitting: *Deleted

## 2014-03-27 NOTE — Telephone Encounter (Signed)
Refill request for Meloxicam 15 mg, quantity of 30 was sent.  Dr. Jake SeatsSikora okayed the refill plus one additional refill.

## 2014-03-27 NOTE — Telephone Encounter (Signed)
Pls call pt - she states she has left multiple messages for a refill on her meds.

## 2014-10-15 ENCOUNTER — Emergency Department (HOSPITAL_COMMUNITY): Payer: Commercial Managed Care - PPO

## 2014-10-15 ENCOUNTER — Observation Stay (HOSPITAL_COMMUNITY)
Admission: EM | Admit: 2014-10-15 | Discharge: 2014-10-16 | Disposition: A | Discharge status: Home / Self Care | Payer: Commercial Managed Care - PPO | Attending: Internal Medicine | Admitting: Internal Medicine

## 2014-10-15 ENCOUNTER — Encounter (HOSPITAL_COMMUNITY): Payer: Self-pay | Admitting: Emergency Medicine

## 2014-10-15 DIAGNOSIS — R55 Syncope and collapse: Secondary | ICD-10-CM | POA: Diagnosis not present

## 2014-10-15 DIAGNOSIS — Z87442 Personal history of urinary calculi: Secondary | ICD-10-CM | POA: Diagnosis not present

## 2014-10-15 DIAGNOSIS — M25551 Pain in right hip: Secondary | ICD-10-CM

## 2014-10-15 DIAGNOSIS — Z91040 Latex allergy status: Secondary | ICD-10-CM | POA: Insufficient documentation

## 2014-10-15 DIAGNOSIS — R531 Weakness: Secondary | ICD-10-CM

## 2014-10-15 DIAGNOSIS — M545 Low back pain, unspecified: Secondary | ICD-10-CM

## 2014-10-15 DIAGNOSIS — Z79899 Other long term (current) drug therapy: Secondary | ICD-10-CM | POA: Diagnosis not present

## 2014-10-15 DIAGNOSIS — K219 Gastro-esophageal reflux disease without esophagitis: Secondary | ICD-10-CM | POA: Diagnosis not present

## 2014-10-15 DIAGNOSIS — G8929 Other chronic pain: Secondary | ICD-10-CM | POA: Diagnosis not present

## 2014-10-15 DIAGNOSIS — R42 Dizziness and giddiness: Secondary | ICD-10-CM | POA: Insufficient documentation

## 2014-10-15 DIAGNOSIS — R51 Headache: Secondary | ICD-10-CM | POA: Insufficient documentation

## 2014-10-15 HISTORY — DX: Unspecified asthma, uncomplicated: J45.909

## 2014-10-15 HISTORY — DX: Adverse effect of unspecified anesthetic, initial encounter: T41.45XA

## 2014-10-15 HISTORY — DX: Other intervertebral disc degeneration, lumbosacral region without mention of lumbar back pain or lower extremity pain: M51.379

## 2014-10-15 HISTORY — DX: Other chronic pain: G89.29

## 2014-10-15 HISTORY — DX: Nausea with vomiting, unspecified: R11.2

## 2014-10-15 HISTORY — DX: Other intervertebral disc degeneration, lumbosacral region: M51.37

## 2014-10-15 HISTORY — DX: Pure hypercholesterolemia, unspecified: E78.00

## 2014-10-15 HISTORY — DX: Migraine, unspecified, not intractable, without status migrainosus: G43.909

## 2014-10-15 HISTORY — DX: Other complications of anesthesia, initial encounter: T88.59XA

## 2014-10-15 HISTORY — DX: Other specified postprocedural states: Z98.890

## 2014-10-15 HISTORY — DX: Syncope and collapse: R55

## 2014-10-15 HISTORY — DX: Other cervical disc degeneration, unspecified cervical region: M50.30

## 2014-10-15 HISTORY — DX: Dorsalgia, unspecified: M54.9

## 2014-10-15 HISTORY — DX: Pneumonia, unspecified organism: J18.9

## 2014-10-15 HISTORY — DX: Unspecified chronic bronchitis: J42

## 2014-10-15 LAB — CBC WITH DIFFERENTIAL/PLATELET
BASOS PCT: 0 % (ref 0–1)
Basophils Absolute: 0 10*3/uL (ref 0.0–0.1)
EOS ABS: 0.2 10*3/uL (ref 0.0–0.7)
EOS PCT: 3 % (ref 0–5)
HEMATOCRIT: 38.7 % (ref 36.0–46.0)
Hemoglobin: 12.8 g/dL (ref 12.0–15.0)
LYMPHS ABS: 1.8 10*3/uL (ref 0.7–4.0)
Lymphocytes Relative: 36 % (ref 12–46)
MCH: 28.5 pg (ref 26.0–34.0)
MCHC: 33.1 g/dL (ref 30.0–36.0)
MCV: 86.2 fL (ref 78.0–100.0)
Monocytes Absolute: 0.5 10*3/uL (ref 0.1–1.0)
Monocytes Relative: 9 % (ref 3–12)
Neutro Abs: 2.7 10*3/uL (ref 1.7–7.7)
Neutrophils Relative %: 52 % (ref 43–77)
Platelets: 301 10*3/uL (ref 150–400)
RBC: 4.49 MIL/uL (ref 3.87–5.11)
RDW: 13.5 % (ref 11.5–15.5)
WBC: 5.2 10*3/uL (ref 4.0–10.5)

## 2014-10-15 LAB — BASIC METABOLIC PANEL
ANION GAP: 10 (ref 5–15)
BUN: 17 mg/dL (ref 6–23)
CHLORIDE: 107 mmol/L (ref 96–112)
CO2: 22 mmol/L (ref 19–32)
Calcium: 9.2 mg/dL (ref 8.4–10.5)
Creatinine, Ser: 0.71 mg/dL (ref 0.50–1.10)
GFR calc Af Amer: >90 mL/min (ref >90)
GFR calc non Af Amer: >90 mL/min (ref >90)
GLUCOSE: 118 mg/dL — AB (ref 70–99)
Potassium: 4.2 mmol/L (ref 3.5–5.1)
SODIUM: 139 mmol/L (ref 135–145)

## 2014-10-15 LAB — URINALYSIS, ROUTINE W REFLEX MICROSCOPIC
Bilirubin Urine: NEGATIVE
Glucose, UA: NEGATIVE mg/dL
HGB URINE DIPSTICK: NEGATIVE
Ketones, ur: NEGATIVE mg/dL
Leukocytes, UA: NEGATIVE
NITRITE: NEGATIVE
PROTEIN: NEGATIVE mg/dL
Specific Gravity, Urine: 1.009 (ref 1.005–1.030)
Urobilinogen, UA: 0.2 mg/dL (ref 0.0–1.0)
pH: 6.5 (ref 5.0–8.0)

## 2014-10-15 LAB — I-STAT TROPONIN, ED: Troponin i, poc: 0 ng/mL (ref 0.00–0.08)

## 2014-10-15 LAB — POC URINE PREG, ED: Preg Test, Ur: NEGATIVE

## 2014-10-15 LAB — CBG MONITORING, ED: Glucose-Capillary: 123 mg/dL — ABNORMAL HIGH (ref 70–99)

## 2014-10-15 MED ORDER — PANTOPRAZOLE SODIUM 40 MG PO TBEC
80.0000 mg | DELAYED_RELEASE_TABLET | Freq: Every day | ORAL | Status: DC
  Administered 2014-10-16: 80 mg via ORAL
  Filled 2014-10-15: qty 2

## 2014-10-15 MED ORDER — SODIUM CHLORIDE 0.9 % IV SOLN
INTRAVENOUS | Status: DC
  Administered 2014-10-15 – 2014-10-16 (×2): via INTRAVENOUS

## 2014-10-15 MED ORDER — HEPARIN SODIUM (PORCINE) 5000 UNIT/ML IJ SOLN
5000.0000 [IU] | Freq: Three times a day (TID) | INTRAMUSCULAR | Status: DC
  Administered 2014-10-15 – 2014-10-16 (×3): 5000 [IU] via SUBCUTANEOUS
  Filled 2014-10-15 (×4): qty 1

## 2014-10-15 MED ORDER — MECLIZINE HCL 25 MG PO TABS
25.0000 mg | ORAL_TABLET | Freq: Once | ORAL | Status: AC
  Administered 2014-10-15: 25 mg via ORAL
  Filled 2014-10-15: qty 1

## 2014-10-15 MED ORDER — MECLIZINE HCL 25 MG PO TABS
25.0000 mg | ORAL_TABLET | Freq: Two times a day (BID) | ORAL | Status: DC
  Administered 2014-10-16 (×2): 25 mg via ORAL
  Filled 2014-10-15 (×3): qty 1

## 2014-10-15 MED ORDER — SODIUM CHLORIDE 0.9 % IJ SOLN
3.0000 mL | Freq: Two times a day (BID) | INTRAMUSCULAR | Status: DC
  Administered 2014-10-15: 3 mL via INTRAVENOUS

## 2014-10-15 MED ORDER — ACETAMINOPHEN 500 MG PO TABS
1000.0000 mg | ORAL_TABLET | Freq: Once | ORAL | Status: AC
  Administered 2014-10-15: 1000 mg via ORAL
  Filled 2014-10-15: qty 2

## 2014-10-15 MED ORDER — HYDROMORPHONE HCL 1 MG/ML IJ SOLN
1.0000 mg | Freq: Once | INTRAMUSCULAR | Status: AC
  Administered 2014-10-15: 1 mg via INTRAVENOUS
  Filled 2014-10-15: qty 1

## 2014-10-15 NOTE — ED Notes (Signed)
Pt was at work and had syncopal episode and hit head on counter top. Pt has hx of chronic back pain. Pt A&O. Pt sts she is having head, back, and hip pain. Pt sts she does not want medication at this time.

## 2014-10-15 NOTE — ED Provider Notes (Signed)
Patient is syncopal event earlier today. She presently complains of headache and sensation of room spinning worse with moving her head or changing position. She has had right arm and right leg weakness for several months. On exam patient is alert Glasgow Coma Score 15 HEENT exam no facial asymmetry neck supple neurologic becomes vertiginous upon standing. It is normal pronator drift normal moves all extremities well motor strength left arm 5 over 5 left leg 5 over 5 right arm 4/5 right leg4/5  Doug SouSam Caz Weaver, MD 10/15/14 2013

## 2014-10-15 NOTE — ED Notes (Signed)
Heather PA ordered in and out due to patient request unable to use bed pad twice.

## 2014-10-15 NOTE — H&P (Signed)
Hospitalist Admission History and Physical  Patient name: Jordan Kim Medical record number: [id number] Date of birth: 02-21-1971 Age: 44 y.o. Gender: female  Primary Care Provider: Renae Fickle, MD  Chief Complaint: syncope   History of Present Illness:This is a 44 y.o. year old female with significant past medical history of GERD, kidney stones, degenerative disc disease s/p laminectomy  presenting with syncope. Pt reports spontaneous syncopal event while at work. Pt states that she woke up on the floor w/ coworkers looking at her. Pt denies any neurocardiogenic prodrome. Pt denies any significant sxs prior to sxs, though reports treatments for bronchitis, UTI w/in the last 5 weeks. Also w/ R sided weakness that has been present for > 3 months. No bowel or bladder incontinence. + vertiginous sxs on presentation to ER s/p fall. Denies prior hx/o vertigo prior to presentation.  Presented to the ER  Afebrile, hemodynamically stable. CBC and CMET WNL. Multiple imaging studies including MRI brain, lumbar spine;  CT head and c spine  and xray T, L spine, R hip xray negative for acute abnormalities. Noted L4-S1 degenerative disease. UA non indicative of infection.   Assessment and Plan: Jordan Kim is a 44 y.o. year old female presenting with syncope   Active Problems:   Syncope   1- Syncope -unclear etiology -MRI, EKG WNL  -2D ECHO per syncope order set  -tele bed -orthostatics  -follow  2- Vertigo  -suspect post traumatic etiology -cont meclizine  -MRI WNL  -consider neuro consult if sxs persist despite treatment   3-GERD -PPI   FEN/GI: heart healthy diet  Prophylaxis: sub q heparin  Disposition: pending further evaluation  Code Status:Full Code    Patient Active Problem List   Diagnosis Date Noted  . Syncope 10/15/2014   Past Medical History: Past Medical History  Diagnosis Date  . Arthritis   . Kidney stones   . GERD (gastroesophageal reflux disease)   .  Anemia     Past Surgical History: Past Surgical History  Procedure Laterality Date  . Shoulder surgery      RIGHT  . Back surgery    . Finger surgery    . Carpel tunnel surgery    . Knee surgery      BOTH  . Cesarean section      Social History: History   Social History  . Marital Status: Married    Spouse Name: N/A  . Number of Children: N/A  . Years of Education: N/A   Social History Main Topics  . Smoking status: Never Smoker   . Smokeless tobacco: Never Used  . Alcohol Use: No  . Drug Use: No  . Sexual Activity: Not on file   Other Topics Concern  . None   Social History Narrative    Family History: History reviewed. No pertinent family history.  Allergies: Allergies  Allergen Reactions  . Morphine And Related Anaphylaxis and Rash  . Demerol [Meperidine]     Cant remember  . Latex Rash    Current Facility-Administered Medications  Medication Dose Route Frequency Provider Last Rate Last Dose  . 0.9 %  sodium chloride infusion   Intravenous Continuous Floydene Flock, MD      . heparin injection 5,000 Units  5,000 Units Subcutaneous 3 times per day Floydene Flock, MD      . Melene Muller ON 10/16/2014] pantoprazole (PROTONIX) EC tablet 80 mg  80 mg Oral Q1200 Floydene Flock, MD      . sodium  chloride 0.9 % injection 3 mL  3 mL Intravenous Q12H Floydene Flock, MD      . triamcinolone acetonide (KENALOG) 10 MG/ML injection 10 mg  10 mg Other Once Alvan Dame, DPM       Current Outpatient Prescriptions  Medication Sig Dispense Refill  . acetaminophen (TYLENOL) 650 MG CR tablet Take 650 mg by mouth every 8 (eight) hours as needed for pain.    . cholecalciferol (VITAMIN D) 1000 UNITS tablet Take 1,000 Units by mouth daily.    . Cyanocobalamin 2500 MCG CHEW Chew 2,500 mcg by mouth daily.    . cyclobenzaprine (FLEXERIL) 10 MG tablet Take 10 mg by mouth 3 (three) times daily as needed for muscle spasms.    Marland Kitchen esomeprazole (NEXIUM) 40 MG capsule Take 40 mg by  mouth daily at 12 noon.    . meloxicam (MOBIC) 15 MG tablet Take 15 mg by mouth daily.    . methocarbamol (ROBAXIN) 500 MG tablet Take 500 mg by mouth every 6 (six) hours as needed for muscle spasms.    . Omega-3 Fatty Acids (FISH OIL) 1000 MG CPDR Take 1,000 mg by mouth daily.    Marland Kitchen omeprazole (PRILOSEC) 20 MG capsule Take 20 mg by mouth daily.    . Probiotic Product (PRO-BIOTIC BLEND PO) Take 1 tablet by mouth daily.     Review Of Systems: 12 point ROS negative except as noted above in HPI.  Physical Exam: Filed Vitals:   10/15/14 2100  BP: 128/59  Pulse: 73  Temp:   Resp:     General: alert and cooperative HEENT: PERRLA and extra ocular movement intact Heart: S1, S2 normal, no murmur, rub or gallop, regular rate and rhythm Lungs: clear to auscultation, no wheezes or rales and unlabored breathing Abdomen: abdomen is soft without significant tenderness, masses, organomegaly or guarding Extremities: extremities normal, atraumatic, no cyanosis or edema Skin:no rashes Neurology: grossly normal exam, minimal to mild R sided weakness, sensation intact   Labs and Imaging: Lab Results  Component Value Date/Time   NA 139 10/15/2014 01:56 PM   K 4.2 10/15/2014 01:56 PM   CL 107 10/15/2014 01:56 PM   CO2 22 10/15/2014 01:56 PM   BUN 17 10/15/2014 01:56 PM   CREATININE 0.71 10/15/2014 01:56 PM   GLUCOSE 118* 10/15/2014 01:56 PM   Lab Results  Component Value Date   WBC 5.2 10/15/2014   HGB 12.8 10/15/2014   HCT 38.7 10/15/2014   MCV 86.2 10/15/2014   PLT 301 10/15/2014   Urinalysis    Component Value Date/Time   COLORURINE YELLOW 10/15/2014 1530   APPEARANCEUR CLEAR 10/15/2014 1530   LABSPEC 1.009 10/15/2014 1530   PHURINE 6.5 10/15/2014 1530   GLUCOSEU NEGATIVE 10/15/2014 1530   HGBUR NEGATIVE 10/15/2014 1530   BILIRUBINUR NEGATIVE 10/15/2014 1530   KETONESUR NEGATIVE 10/15/2014 1530   PROTEINUR NEGATIVE 10/15/2014 1530   UROBILINOGEN 0.2 10/15/2014 1530   NITRITE  NEGATIVE 10/15/2014 1530   LEUKOCYTESUR NEGATIVE 10/15/2014 1530       Dg Thoracic Spine 2 View  10/15/2014   CLINICAL DATA:  Syncope and fall.  Dorsalgia  EXAM: THORACIC SPINE - 3 VIEW  COMPARISON:  Chest radiograph December 16, 2010  FINDINGS: Frontal, lateral, and swimmer's views were obtained. There is no fracture or spondylolisthesis. Disc spaces appear intact. No erosive change.  IMPRESSION: No fracture or spondylolisthesis.  No appreciable arthropathy.   Electronically Signed   By: Bretta Bang III M.D.   On: 10/15/2014  15:23   Dg Lumbar Spine Complete  10/15/2014   CLINICAL DATA:  Pt was at work and had syncopal episode and hit head on counter top. Pt has hx of chronic back pain. Pt A&O. Pt sts she is having head, back, and right hip pain.  EXAM: LUMBAR SPINE - COMPLETE 4+ VIEW  COMPARISON:  12/13/2008  FINDINGS: No fracture. No spondylolisthesis. There is mild loss of disc height at L4-L5. Remaining lumbar disc spaces and the facet joints are well preserved.  Soft tissues are unremarkable.  IMPRESSION: Mild disc degenerative change at L4-L5. No other abnormality. No fracture or acute finding.   Electronically Signed   By: Amie Portlandavid  Ormond M.D.   On: 10/15/2014 15:23   Ct Head Wo Contrast  10/15/2014   CLINICAL DATA:  Syncope with posterior neck pain and occipital headache. Head injury.  EXAM: CT HEAD WITHOUT CONTRAST  CT CERVICAL SPINE WITHOUT CONTRAST  TECHNIQUE: Multidetector CT imaging of the head and cervical spine was performed following the standard protocol without intravenous contrast. Multiplanar CT image reconstructions of the cervical spine were also generated.  COMPARISON:  Cervical spine MRI 05/26/2006  FINDINGS: CT HEAD FINDINGS  Skull and Sinuses:Negative for fracture or destructive process. The mastoids, middle ears, and imaged paranasal sinuses are clear.  Orbits: No acute abnormality.  Brain: No evidence of acute infarction, hemorrhage, hydrocephalus, or mass lesion/mass effect.   CT CERVICAL SPINE FINDINGS  No evidence of cervical spine fracture. There is mild C5-C6 anterolisthesis, which was likely developing on 05/26/2006 cervical spine MRI. No gross cervical canal hematoma or prevertebral edema.  IMPRESSION: No evidence of intracranial injury or cervical spine fracture.   Electronically Signed   By: Marnee SpringJonathon  Watts M.D.   On: 10/15/2014 15:06   Ct Cervical Spine Wo Contrast  10/15/2014   CLINICAL DATA:  Syncope with posterior neck pain and occipital headache. Head injury.  EXAM: CT HEAD WITHOUT CONTRAST  CT CERVICAL SPINE WITHOUT CONTRAST  TECHNIQUE: Multidetector CT imaging of the head and cervical spine was performed following the standard protocol without intravenous contrast. Multiplanar CT image reconstructions of the cervical spine were also generated.  COMPARISON:  Cervical spine MRI 05/26/2006  FINDINGS: CT HEAD FINDINGS  Skull and Sinuses:Negative for fracture or destructive process. The mastoids, middle ears, and imaged paranasal sinuses are clear.  Orbits: No acute abnormality.  Brain: No evidence of acute infarction, hemorrhage, hydrocephalus, or mass lesion/mass effect.  CT CERVICAL SPINE FINDINGS  No evidence of cervical spine fracture. There is mild C5-C6 anterolisthesis, which was likely developing on 05/26/2006 cervical spine MRI. No gross cervical canal hematoma or prevertebral edema.  IMPRESSION: No evidence of intracranial injury or cervical spine fracture.   Electronically Signed   By: Marnee SpringJonathon  Watts M.D.   On: 10/15/2014 15:06   Mr Brain Wo Contrast  10/15/2014   CLINICAL DATA:  44 year old female with history of anemia presenting with syncopal episode hitting head on counter. No loss of consciousness relayed. Initial encounter.  EXAM: MRI HEAD WITHOUT CONTRAST  TECHNIQUE: Multiplanar, multiecho pulse sequences of the brain and surrounding structures were obtained without intravenous contrast.  COMPARISON:  10/15/2014 CT.  FINDINGS: No acute infarct.  No  intracranial hemorrhage.  No intracranial mass lesion noted on this unenhanced exam.  No hydrocephalus  Major intracranial vascular structures are patent.  Cervical medullary junction, pituitary region, pineal region and orbital structures unremarkable.  Slight decreased signal intensity of bone marrow may be related to patient's history of anemia.  Minimal paranasal  sinus mucosal thickening.  IMPRESSION: No acute abnormality.  Please see above.   Electronically Signed   By: Lacy Duverney M.D.   On: 10/15/2014 19:07   Mr Lumbar Spine Wo Contrast  10/15/2014   CLINICAL DATA:  44 year old female with history of anemia presenting with syncopal episode. History of chronic back pain. Initial encounter.  EXAM: MRI LUMBAR SPINE WITHOUT CONTRAST  TECHNIQUE: Multiplanar, multisequence MR imaging of the lumbar spine was performed. No intravenous contrast was administered.  COMPARISON:  10/15/2014 plain film examination.  12/07/2008 MR.  FINDINGS: Last fully open disk space is labeled L5-S1. Present examination incorporates from T11-12 disc space through the coccyx. Conus L1 level.  Hemangioma/focal fatty deposit L3. Decreased signal intensity of bone marrow unchanged from prior exam and may be related to patient's anemia or habitus.  Small uterine fibroid may be partially hemorrhagic and incompletely assessed on present exam.  No acute osseous abnormality.  T11-12 through L2-3 unremarkable.  L3-4:  Very mild facet joint degenerative changes.  L4-5: Facet joint degenerative changes. Bulge with greatest extension right paracentral position. Mild thecal sac narrowing greater on the right. Mild narrowing superior aspect right lateral recess with mild crowding of the origin of the right L5 nerve root.  L5-S1: Prior left hemilaminectomy. Facet joint degenerative changes. Disc degeneration with bulge touching but not compressing the S1 nerve roots. No significant thecal sac narrowing.  IMPRESSION: Slight progressive degenerative  changes L4-5 level. Summary of pertinent findings include:  L4-5 facet joint degenerative changes. Bulge with greatest extension right paracentral position. Mild thecal sac narrowing greater on the right. Mild narrowing superior aspect right lateral recess with mild crowding of the origin of the right L5 nerve root.  L5-S1 prior left hemilaminectomy. Facet joint degenerative changes. Disc degeneration with bulge touching but not compressing the S1 nerve roots. No significant thecal sac narrowing.  Suggestion of small uterine fibroid which may be partially hemorrhagic incompletely assessed on present exam.   Electronically Signed   By: Lacy Duverney M.D.   On: 10/15/2014 19:22   Dg Hip Unilat With Pelvis 2-3 Views Right  10/15/2014   CLINICAL DATA:  Syncope with right hip pain.  Initial encounter.  EXAM: RIGHT HIP (WITH PELVIS) 2-3 VIEWS  COMPARISON:  None.  FINDINGS: There is no evidence of hip fracture or dislocation. No pelvic ring fracture or diastasis.  IMPRESSION: Negative.   Electronically Signed   By: Marnee Spring M.D.   On: 10/15/2014 15:23           Doree Albee MD  Pager: [phone]

## 2014-10-15 NOTE — ED Provider Notes (Signed)
CSN: [id number]     Arrival date & time 10/15/14  1247 History   First MD Initiated Contact with Patient 10/15/14 1248     Chief Complaint  Patient presents with  . Loss of Consciousness     (Consider location/radiation/quality/duration/timing/severity/associated sxs/prior Treatment) HPI Comments: Patient presents today after a syncopal event that occurred at work while standing  just prior to arrival.  She reports that she does not remember the event or how she felt prior to the syncope.  EMS reports that her coworkers stated that she hit her head on the counter top and fell to the ground when she had the syncopal episode.  She is unsure how long she loss consciousness for.  No seizure activity reported.  She reports that her coworkers stated that her eyes rolled in back of her head prior to passing out.  She denies any chest pain, SOB, vision changes, fever, chills, nausea, vomiting, focal weakness, numbness, or tingling.  She does report that she currently has a headache, neck pain, back pain, and right hip pain.  She states that she has chronic lower back pain, but the pain worsened after the fall.  She has not taken anything for pain prior to arrival and does not want any pain medication at this time.  She states that she was in her normal state of health prior to the syncopal event.  No prior cardiac history.  No history of syncope. No prior history of CVA.    The history is provided by the patient and the EMS personnel.    Past Medical History  Diagnosis Date  . Arthritis   . Kidney stones   . GERD (gastroesophageal reflux disease)   . Anemia    Past Surgical History  Procedure Laterality Date  . Shoulder surgery      RIGHT  . Back surgery    . Finger surgery    . Carpel tunnel surgery    . Knee surgery      BOTH  . Cesarean section     History reviewed. No pertinent family history. History  Substance Use Topics  . Smoking status: Never Smoker   . Smokeless tobacco: Never  Used  . Alcohol Use: No   OB History    No data available     Review of Systems  All other systems reviewed and are negative.     Allergies  Morphine and related; Demerol; and Latex  Home Medications   Prior to Admission medications   Medication Sig Start Date End Date Taking? Authorizing Provider  acetaminophen (TYLENOL) 650 MG CR tablet Take 650 mg by mouth every 8 (eight) hours as needed for pain.    Historical Provider, MD  cyclobenzaprine (FLEXERIL) 10 MG tablet Take 10 mg by mouth 3 (three) times daily as needed for muscle spasms.    Historical Provider, MD  meloxicam (MOBIC) 15 MG tablet Take 15 mg by mouth daily.    Historical Provider, MD  methocarbamol (ROBAXIN) 500 MG tablet Take 500 mg by mouth every 8 (eight) hours as needed for muscle spasms.    Historical Provider, MD  omeprazole (PRILOSEC) 20 MG capsule Take 20 mg by mouth daily.    Historical Provider, MD   BP 118/71 mmHg  Temp(Src) 97.8 F (36.6 C) (Oral)  Resp 16  SpO2 89% Physical Exam  Constitutional: She appears well-developed and well-nourished.  HENT:  Head: Normocephalic and atraumatic.  Mouth/Throat: Oropharynx is clear and moist.  Eyes: EOM are normal. Pupils  are equal, round, and reactive to light.  Neck: Normal range of motion. Neck supple.  Cardiovascular: Normal rate, regular rhythm and normal heart sounds.   Pulmonary/Chest: Effort normal and breath sounds normal.  Abdominal: Soft. Bowel sounds are normal.  Musculoskeletal:       Right hip: She exhibits tenderness and bony tenderness.       Thoracic back: She exhibits tenderness and bony tenderness. She exhibits normal range of motion, no swelling, no edema and no deformity.       Lumbar back: She exhibits tenderness and bony tenderness. She exhibits normal range of motion, no swelling, no edema and no deformity.  Neurological: She is alert. No cranial nerve deficit or sensory deficit.  Muscle strength 4/5 RUE and RLE Muscle strength 5/5  LUE and LLE Distal sensation of both feet intact  Skin: Skin is warm and dry.  Psychiatric: She has a normal mood and affect.  Nursing note and vitals reviewed.   ED Course  Procedures (including critical care time) Labs Review Labs Reviewed - No data to display  Imaging Review Dg Thoracic Spine 2 View  10/15/2014   CLINICAL DATA:  Syncope and fall.  Dorsalgia  EXAM: THORACIC SPINE - 3 VIEW  COMPARISON:  Chest radiograph December 16, 2010  FINDINGS: Frontal, lateral, and swimmer's views were obtained. There is no fracture or spondylolisthesis. Disc spaces appear intact. No erosive change.  IMPRESSION: No fracture or spondylolisthesis.  No appreciable arthropathy.   Electronically Signed   By: Bretta BangWilliam  Woodruff III M.D.   On: 10/15/2014 15:23   Dg Lumbar Spine Complete  10/15/2014   CLINICAL DATA:  Pt was at work and had syncopal episode and hit head on counter top. Pt has hx of chronic back pain. Pt A&O. Pt sts she is having head, back, and right hip pain.  EXAM: LUMBAR SPINE - COMPLETE 4+ VIEW  COMPARISON:  12/13/2008  FINDINGS: No fracture. No spondylolisthesis. There is mild loss of disc height at L4-L5. Remaining lumbar disc spaces and the facet joints are well preserved.  Soft tissues are unremarkable.  IMPRESSION: Mild disc degenerative change at L4-L5. No other abnormality. No fracture or acute finding.   Electronically Signed   By: Amie Portlandavid  Ormond M.D.   On: 10/15/2014 15:23   Ct Head Wo Contrast  10/15/2014   CLINICAL DATA:  Syncope with posterior neck pain and occipital headache. Head injury.  EXAM: CT HEAD WITHOUT CONTRAST  CT CERVICAL SPINE WITHOUT CONTRAST  TECHNIQUE: Multidetector CT imaging of the head and cervical spine was performed following the standard protocol without intravenous contrast. Multiplanar CT image reconstructions of the cervical spine were also generated.  COMPARISON:  Cervical spine MRI 05/26/2006  FINDINGS: CT HEAD FINDINGS  Skull and Sinuses:Negative for fracture or  destructive process. The mastoids, middle ears, and imaged paranasal sinuses are clear.  Orbits: No acute abnormality.  Brain: No evidence of acute infarction, hemorrhage, hydrocephalus, or mass lesion/mass effect.  CT CERVICAL SPINE FINDINGS  No evidence of cervical spine fracture. There is mild C5-C6 anterolisthesis, which was likely developing on 05/26/2006 cervical spine MRI. No gross cervical canal hematoma or prevertebral edema.  IMPRESSION: No evidence of intracranial injury or cervical spine fracture.   Electronically Signed   By: Marnee SpringJonathon  Watts M.D.   On: 10/15/2014 15:06   Ct Cervical Spine Wo Contrast  10/15/2014   CLINICAL DATA:  Syncope with posterior neck pain and occipital headache. Head injury.  EXAM: CT HEAD WITHOUT CONTRAST  CT CERVICAL  SPINE WITHOUT CONTRAST  TECHNIQUE: Multidetector CT imaging of the head and cervical spine was performed following the standard protocol without intravenous contrast. Multiplanar CT image reconstructions of the cervical spine were also generated.  COMPARISON:  Cervical spine MRI 05/26/2006  FINDINGS: CT HEAD FINDINGS  Skull and Sinuses:Negative for fracture or destructive process. The mastoids, middle ears, and imaged paranasal sinuses are clear.  Orbits: No acute abnormality.  Brain: No evidence of acute infarction, hemorrhage, hydrocephalus, or mass lesion/mass effect.  CT CERVICAL SPINE FINDINGS  No evidence of cervical spine fracture. There is mild C5-C6 anterolisthesis, which was likely developing on 05/26/2006 cervical spine MRI. No gross cervical canal hematoma or prevertebral edema.  IMPRESSION: No evidence of intracranial injury or cervical spine fracture.   Electronically Signed   By: Marnee Spring M.D.   On: 10/15/2014 15:06   Mr Brain Wo Contrast  10/15/2014   CLINICAL DATA:  44 year old female with history of anemia presenting with syncopal episode hitting head on counter. No loss of consciousness relayed. Initial encounter.  EXAM: MRI HEAD  WITHOUT CONTRAST  TECHNIQUE: Multiplanar, multiecho pulse sequences of the brain and surrounding structures were obtained without intravenous contrast.  COMPARISON:  10/15/2014 CT.  FINDINGS: No acute infarct.  No intracranial hemorrhage.  No intracranial mass lesion noted on this unenhanced exam.  No hydrocephalus  Major intracranial vascular structures are patent.  Cervical medullary junction, pituitary region, pineal region and orbital structures unremarkable.  Slight decreased signal intensity of bone marrow may be related to patient's history of anemia.  Minimal paranasal sinus mucosal thickening.  IMPRESSION: No acute abnormality.  Please see above.   Electronically Signed   By: Lacy Duverney M.D.   On: 10/15/2014 19:07   Mr Lumbar Spine Wo Contrast  10/15/2014   CLINICAL DATA:  44 year old female with history of anemia presenting with syncopal episode. History of chronic back pain. Initial encounter.  EXAM: MRI LUMBAR SPINE WITHOUT CONTRAST  TECHNIQUE: Multiplanar, multisequence MR imaging of the lumbar spine was performed. No intravenous contrast was administered.  COMPARISON:  10/15/2014 plain film examination.  12/07/2008 MR.  FINDINGS: Last fully open disk space is labeled L5-S1. Present examination incorporates from T11-12 disc space through the coccyx. Conus L1 level.  Hemangioma/focal fatty deposit L3. Decreased signal intensity of bone marrow unchanged from prior exam and may be related to patient's anemia or habitus.  Small uterine fibroid may be partially hemorrhagic and incompletely assessed on present exam.  No acute osseous abnormality.  T11-12 through L2-3 unremarkable.  L3-4:  Very mild facet joint degenerative changes.  L4-5: Facet joint degenerative changes. Bulge with greatest extension right paracentral position. Mild thecal sac narrowing greater on the right. Mild narrowing superior aspect right lateral recess with mild crowding of the origin of the right L5 nerve root.  L5-S1: Prior  left hemilaminectomy. Facet joint degenerative changes. Disc degeneration with bulge touching but not compressing the S1 nerve roots. No significant thecal sac narrowing.  IMPRESSION: Slight progressive degenerative changes L4-5 level. Summary of pertinent findings include:  L4-5 facet joint degenerative changes. Bulge with greatest extension right paracentral position. Mild thecal sac narrowing greater on the right. Mild narrowing superior aspect right lateral recess with mild crowding of the origin of the right L5 nerve root.  L5-S1 prior left hemilaminectomy. Facet joint degenerative changes. Disc degeneration with bulge touching but not compressing the S1 nerve roots. No significant thecal sac narrowing.  Suggestion of small uterine fibroid which may be partially hemorrhagic incompletely assessed on  present exam.   Electronically Signed   By: Lacy Duverney M.D.   On: 10/15/2014 19:22   Dg Hip Unilat With Pelvis 2-3 Views Right  10/15/2014   CLINICAL DATA:  Syncope with right hip pain.  Initial encounter.  EXAM: RIGHT HIP (WITH PELVIS) 2-3 VIEWS  COMPARISON:  None.  FINDINGS: There is no evidence of hip fracture or dislocation. No pelvic ring fracture or diastasis.  IMPRESSION: Negative.   Electronically Signed   By: Marnee Spring M.D.   On: 10/15/2014 15:23     EKG Interpretation   Date/Time:  Monday October 15 2014 12:57:11 EDT Ventricular Rate:  78 PR Interval:  161 QRS Duration: 105 QT Interval:  388 QTC Calculation: 442 R Axis:   1 Text Interpretation:  Sinus rhythm RSR' in V1 or V2, probably normal  variant new RSR' Confirmed by Manus Gunning  MD, STEPHEN [phone]) on 10/15/2014  1:16:27 PM      4:00 PM Discussed right sided weakness with the patient.  She states that she feels that her right leg has been feeling weak over the past couple of weeks.    4:13 PM Discussed with Dr Roseanne Reno with Neurology.  He recommends MRI brain w/o contrast.   4:30 PM Patient signed out to [facility], PA-C at shift change.  MRI brain and MRI lumbar spine pending. Marland Kitchen  MDM   Final diagnoses:  None   Patient presents today after a syncopal event that occurred at work just prior to arrival.  She is unclear if there was any prodrome to the syncope.  No ischemic changes on EKG.  Labs unremarkable.  CT head and cervical spine negative for acute findings.  Xrays of thoracic and lumbar spine negative.  Xray of right hip negative. On exam, she is found to have some weakness of the RLE and RUE.  She does report that her right leg has been feeling weak for the past couple of weeks.  She has a history of chronic lower back pain, but states that the pain is slightly worse today.  Discussed with Dr. Roseanne Reno with Neurology.  He recommends MRI brain.  MRI lumbar spine also ordered.  Hannah Muthersbaugh, PA-C will follow up on the results.      Santiago Glad, PA-C 10/16/14 2248  Glynn Octave, MD 10/17/14 [phone]

## 2014-10-15 NOTE — ED Notes (Signed)
Report attempted 

## 2014-10-15 NOTE — ED Notes (Signed)
Able to do orthostatic on pt. When sitting on side of bed pt complained of dizziness. Pt was really dizzy while standing and had to hold on to myself. Also complained of nausea.

## 2014-10-15 NOTE — ED Provider Notes (Signed)
  Care assumed from  Jordan Kim is a 44 y.o. female presents after syncope without prodrome, witnessed with fall and head injury.  Pt reports feeling normal prior.  Pt witnesses state no seizure activity.  Pt now with RUE and RLE weakness on initial exam.  Question new or old deficits Pt reporting "right foot drop" over the last several days.    Jordan RenoStewart, MD was consulted who recommended MRI brain without contrast.  Pt with low back pain and difficulty urinating (urinary retention) that has resolved therefore will MRI her lumbar spine.  Denies incontinence and has has no urinary retention previously.     Physical Exam  BP 130/61 mmHg  Pulse 71  Temp(Src) 97.8 F (36.6 C) (Oral)  Resp 19  SpO2 98%  LMP 09/24/2014  Physical Exam   Face to face Exam:   General: Awake  HEENT: Atraumatic  Resp: Normal effort  Abd: Nondistended  Neuro:Pt with severe vertigo upon standing   RUE - 4/5  RLE - 4/5  LUE - 5/5  LLE - 5/5 Lymph: No adenopathy    ED Course  Procedures  1. Syncope and collapse   2. Lower back pain   3. Right hip pain   4. Vertigo   5. Right sided weakness    MDM Jordan Kim presents with syncope and now with new right sided weakness.    Plan:  If MRI x2 is negative she can be d/c home.  If she remains weak she needs to be evaluated by neurology.  If total resolution of weakness and normal MRI she may be d/c home.    8:13 PM MRI head without evidence of acute abnormality. MRI spine with chronic changes and no evidence of cauda equina. Pt unable to walk due to vertigo. Pt continues to complain of headache. Will treat symptoms here in the ED.  Unknown cause of syncope however I believe her vertigo is a sequela of the head injury.  Will admit.   8:33 PM Discussed with Dr. Alvester MorinNewton who will evaluate for admission.    The patient was discussed with and seen by Dr. Ethelda ChickJacubowitz who agrees with the treatment plan.   Dahlia ClientHannah Maykayla Highley, PA-C 10/15/14 2033  Doug SouSam  Jacubowitz, MD 10/16/14 [phone]

## 2014-10-16 ENCOUNTER — Encounter (HOSPITAL_COMMUNITY): Payer: Self-pay | Admitting: General Practice

## 2014-10-16 DIAGNOSIS — R55 Syncope and collapse: Secondary | ICD-10-CM | POA: Diagnosis not present

## 2014-10-16 LAB — CBC WITH DIFFERENTIAL/PLATELET
BASOS PCT: 0 % (ref 0–1)
Basophils Absolute: 0 10*3/uL (ref 0.0–0.1)
EOS ABS: 0.3 10*3/uL (ref 0.0–0.7)
Eosinophils Relative: 6 % — ABNORMAL HIGH (ref 0–5)
HCT: 38 % (ref 36.0–46.0)
Hemoglobin: 12.3 g/dL (ref 12.0–15.0)
Lymphocytes Relative: 44 % (ref 12–46)
Lymphs Abs: 2.1 10*3/uL (ref 0.7–4.0)
MCH: 28.3 pg (ref 26.0–34.0)
MCHC: 32.4 g/dL (ref 30.0–36.0)
MCV: 87.4 fL (ref 78.0–100.0)
MONOS PCT: 13 % — AB (ref 3–12)
Monocytes Absolute: 0.6 10*3/uL (ref 0.1–1.0)
NEUTROS PCT: 37 % — AB (ref 43–77)
Neutro Abs: 1.8 10*3/uL (ref 1.7–7.7)
Platelets: 299 10*3/uL (ref 150–400)
RBC: 4.35 MIL/uL (ref 3.87–5.11)
RDW: 13.6 % (ref 11.5–15.5)
WBC: 4.9 10*3/uL (ref 4.0–10.5)

## 2014-10-16 LAB — COMPREHENSIVE METABOLIC PANEL
ALBUMIN: 3.1 g/dL — AB (ref 3.5–5.2)
ALT: 15 U/L (ref 0–35)
AST: 20 U/L (ref 0–37)
Alkaline Phosphatase: 69 U/L (ref 39–117)
Anion gap: 11 (ref 5–15)
BUN: 13 mg/dL (ref 6–23)
CHLORIDE: 104 mmol/L (ref 96–112)
CO2: 23 mmol/L (ref 19–32)
Calcium: 8.4 mg/dL (ref 8.4–10.5)
Creatinine, Ser: 0.66 mg/dL (ref 0.50–1.10)
GFR calc Af Amer: >90 mL/min (ref >90)
GFR calc non Af Amer: >90 mL/min (ref >90)
Glucose, Bld: 94 mg/dL (ref 70–99)
Potassium: 3.5 mmol/L (ref 3.5–5.1)
Sodium: 138 mmol/L (ref 135–145)
TOTAL PROTEIN: 6.2 g/dL (ref 6.0–8.3)
Total Bilirubin: 0.5 mg/dL (ref 0.3–1.2)

## 2014-10-16 LAB — GLUCOSE, CAPILLARY: Glucose-Capillary: 87 mg/dL (ref 70–99)

## 2014-10-16 MED ORDER — KETOROLAC TROMETHAMINE 30 MG/ML IJ SOLN: 30.0000 mg | Freq: Four times a day (QID) | INTRAMUSCULAR | Status: DC | PRN

## 2014-10-16 MED ORDER — PNEUMOCOCCAL VAC POLYVALENT 25 MCG/0.5ML IJ INJ
0.5000 mL | INJECTION | Freq: Once | INTRAMUSCULAR | Status: AC
  Administered 2014-10-16: 0.5 mL via INTRAMUSCULAR
  Filled 2014-10-16: qty 0.5

## 2014-10-16 MED ORDER — MECLIZINE HCL 25 MG PO TABS
25.0000 mg | ORAL_TABLET | Freq: Three times a day (TID) | ORAL | Status: DC | PRN
  Filled 2014-10-16: qty 1

## 2014-10-16 MED ORDER — MECLIZINE HCL 25 MG PO TABS: 25.0000 mg | ORAL_TABLET | Freq: Three times a day (TID) | ORAL | Status: DC | PRN

## 2014-10-16 MED ORDER — ACETAMINOPHEN 325 MG PO TABS
650.0000 mg | ORAL_TABLET | Freq: Four times a day (QID) | ORAL | Status: DC | PRN
  Administered 2014-10-16 (×2): 650 mg via ORAL
  Filled 2014-10-16 (×2): qty 2

## 2014-10-16 NOTE — Progress Notes (Signed)
To Whom It May Concern:   Jordan Kim is unable to work from 10/15/2014 to 10/21/2014 due to illness.   Sincerely, Crista Curborinna Quinisha Mould, MD [facility]

## 2014-10-16 NOTE — Progress Notes (Signed)
UR completed 

## 2014-10-16 NOTE — Progress Notes (Signed)
  Echocardiogram 2D Echocardiogram has been performed.  Janalyn HarderWest, Chyler Creely R 10/16/2014, 1:37 PM

## 2014-10-17 LAB — URINE CULTURE
COLONY COUNT: NO GROWTH
CULTURE: NO GROWTH

## 2014-10-21 NOTE — Discharge Summary (Signed)
Physician Discharge Summary  Jordan Kim WUJ:[id number] DOB: Dec 04, 1970 DOA: 10/15/2014  PCP: Jordan Pen, MD  Admit date: 10/15/2014 Discharge date: 10/16/14  Discharge Diagnoses:  Principal problem Syncope  Probable concussion with postconcussive syndrome DDD GERD  Discharge Condition: stable  Diet recommendation: general  Filed Weights   10/15/14 2235 10/16/14 0527  Weight: 107.4 kg (236 lb 12.4 oz) 108 kg (238 lb 1.6 oz)    History of present illness:  44 y.o. year old female with significant past medical history of GERD, kidney stones, degenerative disc disease s/p laminectomy presenting with syncope. Pt reports spontaneous syncopal event while at work. Pt states that she woke up on the floor w/ coworkers looking at her. Pt denies any neurocardiogenic prodrome. Pt denies any significant sxs prior to sxs, though reports treatments for bronchitis, UTI w/in the last 5 weeks. Also w/ R sided weakness that has been present for > 3 months. No bowel or bladder incontinence. + vertiginous sxs on presentation to ER s/p fall. Denies prior hx/o vertigo prior to presentation.  Presented to the ER Afebrile, hemodynamically stable. CBC and CMET WNL. Multiple imaging studies including MRI brain, lumbar spine; CT head and c spine and xray T, L spine, R hip xray negative for acute abnormalities. Noted L4-S1 degenerative disease. UA non indicative of infection.  Hospital Course:  Observed overnight on telemetry, remained in NSR.  Given IVF.  Patient reports she had not been eating or drinking much. May have contributed. Also reports hitting her head then lost consciousness. Likely had concussion. Was dizzy for a while after, improved with meclizine. Echo without significant findings.  By discharge, ambulating and feeling better, requesting discharge.  Procedures:  stable  Consultations:  none  Discharge Exam: Filed Vitals:   10/16/14 1443  BP: 122/65  Pulse:   Temp:   Resp:      General: a and o Cardiovascular: RRR Respiratory: CTA Neuro: nonfocal  Discharge Instructions   Discharge Instructions    Diet general    Complete by:  As directed      Discharge instructions    Complete by:  As directed   Drink plenty of fluids     Increase activity slowly    Complete by:  As directed           Discharge Medication List as of 10/16/2014  7:25 PM    START taking these medications   Details  meclizine (ANTIVERT) 25 MG tablet Take 1 tablet (25 mg total) by mouth 3 (three) times daily as needed (vertigo)., Starting 10/16/2014, Until Discontinued, Print      CONTINUE these medications which have NOT CHANGED   Details  acetaminophen (TYLENOL) 650 MG CR tablet Take 650 mg by mouth every 8 (eight) hours as needed for pain., Until Discontinued, Historical Med    cholecalciferol (VITAMIN D) 1000 UNITS tablet Take 1,000 Units by mouth daily., Until Discontinued, Historical Med    Cyanocobalamin 2500 MCG CHEW Chew 2,500 mcg by mouth daily., Until Discontinued, Historical Med    cyclobenzaprine (FLEXERIL) 10 MG tablet Take 10 mg by mouth 3 (three) times daily as needed for muscle spasms., Until Discontinued, Historical Med    esomeprazole (NEXIUM) 40 MG capsule Take 40 mg by mouth daily at 12 noon., Until Discontinued, Historical Med    meloxicam (MOBIC) 15 MG tablet Take 15 mg by mouth daily., Until Discontinued, Historical Med    methocarbamol (ROBAXIN) 500 MG tablet Take 500 mg by mouth every 6 (six) hours as needed  for muscle spasms., Until Discontinued, Historical Med    Omega-3 Fatty Acids (FISH OIL) 1000 MG CPDR Take 1,000 mg by mouth daily., Until Discontinued, Historical Med    Probiotic Product (PRO-BIOTIC BLEND PO) Take 1 tablet by mouth daily., Until Discontinued, Historical Med      STOP taking these medications     omeprazole (PRILOSEC) 20 MG capsule        Allergies  Allergen Reactions  . Morphine And Related Anaphylaxis and Rash  .  Demerol [Meperidine]     Cant remember  . Latex Rash      The results of significant diagnostics from this hospitalization (including imaging, microbiology, ancillary and laboratory) are listed below for reference.    Significant Diagnostic Studies: Dg Thoracic Spine 2 View  10/15/2014   CLINICAL DATA:  Syncope and fall.  Dorsalgia  EXAM: THORACIC SPINE - 3 VIEW  COMPARISON:  Chest radiograph December 16, 2010  FINDINGS: Frontal, lateral, and swimmer's views were obtained. There is no fracture or spondylolisthesis. Disc spaces appear intact. No erosive change.  IMPRESSION: No fracture or spondylolisthesis.  No appreciable arthropathy.   Electronically Signed   By: Bretta Bang III M.D.   On: 10/15/2014 15:23   Dg Lumbar Spine Complete  10/15/2014   CLINICAL DATA:  Pt was at work and had syncopal episode and hit head on counter top. Pt has hx of chronic back pain. Pt A&O. Pt sts she is having head, back, and right hip pain.  EXAM: LUMBAR SPINE - COMPLETE 4+ VIEW  COMPARISON:  12/13/2008  FINDINGS: No fracture. No spondylolisthesis. There is mild loss of disc height at L4-L5. Remaining lumbar disc spaces and the facet joints are well preserved.  Soft tissues are unremarkable.  IMPRESSION: Mild disc degenerative change at L4-L5. No other abnormality. No fracture or acute finding.   Electronically Signed   By: Amie Portland M.D.   On: 10/15/2014 15:23   Ct Head Wo Contrast  10/15/2014   CLINICAL DATA:  Syncope with posterior neck pain and occipital headache. Head injury.  EXAM: CT HEAD WITHOUT CONTRAST  CT CERVICAL SPINE WITHOUT CONTRAST  TECHNIQUE: Multidetector CT imaging of the head and cervical spine was performed following the standard protocol without intravenous contrast. Multiplanar CT image reconstructions of the cervical spine were also generated.  COMPARISON:  Cervical spine MRI 05/26/2006  FINDINGS: CT HEAD FINDINGS  Skull and Sinuses:Negative for fracture or destructive process. The  mastoids, middle ears, and imaged paranasal sinuses are clear.  Orbits: No acute abnormality.  Brain: No evidence of acute infarction, hemorrhage, hydrocephalus, or mass lesion/mass effect.  CT CERVICAL SPINE FINDINGS  No evidence of cervical spine fracture. There is mild C5-C6 anterolisthesis, which was likely developing on 05/26/2006 cervical spine MRI. No gross cervical canal hematoma or prevertebral edema.  IMPRESSION: No evidence of intracranial injury or cervical spine fracture.   Electronically Signed   By: Marnee Spring M.D.   On: 10/15/2014 15:06   Ct Cervical Spine Wo Contrast  10/15/2014   CLINICAL DATA:  Syncope with posterior neck pain and occipital headache. Head injury.  EXAM: CT HEAD WITHOUT CONTRAST  CT CERVICAL SPINE WITHOUT CONTRAST  TECHNIQUE: Multidetector CT imaging of the head and cervical spine was performed following the standard protocol without intravenous contrast. Multiplanar CT image reconstructions of the cervical spine were also generated.  COMPARISON:  Cervical spine MRI 05/26/2006  FINDINGS: CT HEAD FINDINGS  Skull and Sinuses:Negative for fracture or destructive process. The mastoids, middle ears,  and imaged paranasal sinuses are clear.  Orbits: No acute abnormality.  Brain: No evidence of acute infarction, hemorrhage, hydrocephalus, or mass lesion/mass effect.  CT CERVICAL SPINE FINDINGS  No evidence of cervical spine fracture. There is mild C5-C6 anterolisthesis, which was likely developing on 05/26/2006 cervical spine MRI. No gross cervical canal hematoma or prevertebral edema.  IMPRESSION: No evidence of intracranial injury or cervical spine fracture.   Electronically Signed   By: Marnee SpringJonathon  Watts M.D.   On: 10/15/2014 15:06   Mr Brain Wo Contrast  10/15/2014   CLINICAL DATA:  [age] year old female with history of anemia presenting with syncopal episode hitting head on counter. No loss of consciousness relayed. Initial encounter.  EXAM: MRI HEAD WITHOUT CONTRAST  TECHNIQUE:  Multiplanar, multiecho pulse sequences of the brain and surrounding structures were obtained without intravenous contrast.  COMPARISON:  10/15/2014 CT.  FINDINGS: No acute infarct.  No intracranial hemorrhage.  No intracranial mass lesion noted on this unenhanced exam.  No hydrocephalus  Major intracranial vascular structures are patent.  Cervical medullary junction, pituitary region, pineal region and orbital structures unremarkable.  Slight decreased signal intensity of bone marrow may be related to patient's history of anemia.  Minimal paranasal sinus mucosal thickening.  IMPRESSION: No acute abnormality.  Please see above.   Electronically Signed   By: Lacy DuverneySteven  Olson M.D.   On: 10/15/2014 19:07   Mr Lumbar Spine Wo Contrast  10/15/2014   CLINICAL DATA:  44 year old female with history of anemia presenting with syncopal episode. History of chronic back pain. Initial encounter.  EXAM: MRI LUMBAR SPINE WITHOUT CONTRAST  TECHNIQUE: Multiplanar, multisequence MR imaging of the lumbar spine was performed. No intravenous contrast was administered.  COMPARISON:  10/15/2014 plain film examination.  12/07/2008 MR.  FINDINGS: Last fully open disk space is labeled L5-S1. Present examination incorporates from T11-12 disc space through the coccyx. Conus L1 level.  Hemangioma/focal fatty deposit L3. Decreased signal intensity of bone marrow unchanged from prior exam and may be related to patient's anemia or habitus.  Small uterine fibroid may be partially hemorrhagic and incompletely assessed on present exam.  No acute osseous abnormality.  T11-12 through L2-3 unremarkable.  L3-4:  Very mild facet joint degenerative changes.  L4-5: Facet joint degenerative changes. Bulge with greatest extension right paracentral position. Mild thecal sac narrowing greater on the right. Mild narrowing superior aspect right lateral recess with mild crowding of the origin of the right L5 nerve root.  L5-S1: Prior left hemilaminectomy. Facet  joint degenerative changes. Disc degeneration with bulge touching but not compressing the S1 nerve roots. No significant thecal sac narrowing.  IMPRESSION: Slight progressive degenerative changes L4-5 level. Summary of pertinent findings include:  L4-5 facet joint degenerative changes. Bulge with greatest extension right paracentral position. Mild thecal sac narrowing greater on the right. Mild narrowing superior aspect right lateral recess with mild crowding of the origin of the right L5 nerve root.  L5-S1 prior left hemilaminectomy. Facet joint degenerative changes. Disc degeneration with bulge touching but not compressing the S1 nerve roots. No significant thecal sac narrowing.  Suggestion of small uterine fibroid which may be partially hemorrhagic incompletely assessed on present exam.   Electronically Signed   By: Lacy DuverneySteven  Olson M.D.   On: 10/15/2014 19:22   Dg Hip Unilat With Pelvis 2-3 Views Right  10/15/2014   CLINICAL DATA:  Syncope with right hip pain.  Initial encounter.  EXAM: RIGHT HIP (WITH PELVIS) 2-3 VIEWS  COMPARISON:  None.  FINDINGS: There is  no evidence of hip fracture or dislocation. No pelvic ring fracture or diastasis.  IMPRESSION: Negative.   Electronically Signed   By: Marnee Spring M.D.   On: 10/15/2014 15:23   Echo Left ventricle: The cavity size was normal. There was mild concentric hypertrophy. Systolic function was normal. The estimated ejection fraction was in the range of 60% to 65%. Wall motion was normal; there were no regional wall motion abnormalities. Left ventricular diastolic function parameters were normal. - Aortic valve: Trileaflet; normal thickness leaflets. There was no regurgitation. - Mitral valve: Structurally normal valve. There was no regurgitation. - Left atrium: The atrium was normal in size. - Right ventricle: The cavity size was normal. Wall thickness was normal. Systolic function was normal. - Right atrium: The atrium was  normal in size. - Tricuspid valve: There was trivial regurgitation. - Pulmonary arteries: Systolic pressure was within the normal range. - Inferior vena cava: The vessel was normal in size. - Pericardium, extracardiac: There was no pericardial effusion.  Impressions:  - Mild concentric LVH, trivial TR, otherwise normal study. Microbiology: Recent Results (from the past 240 hour(s))  Culture, Urine     Status: None   Collection Time: 10/15/14  3:30 PM  Result Value Ref Range Status   Specimen Description URINE, RANDOM  Final   Special Requests ADDED 2253  Final   Colony Count NO GROWTH Performed at [facility]   Final   Culture NO GROWTH Performed at [facility]   Final   Report Status 10/17/2014 FINAL  Final     Labs: Basic Metabolic Panel:  Recent Labs Lab 10/15/14 1356 10/16/14 0740  NA 139 138  K 4.2 3.5  CL 107 104  CO2 22 23  GLUCOSE 118* 94  BUN 17 13  CREATININE 0.71 0.66  CALCIUM 9.2 8.4   Liver Function Tests:  Recent Labs Lab 10/16/14 0740  AST 20  ALT 15  ALKPHOS 69  BILITOT 0.5  PROT 6.2  ALBUMIN 3.1*   No results for input(s): LIPASE, AMYLASE in the last 168 hours. No results for input(s): AMMONIA in the last 168 hours. CBC:  Recent Labs Lab 10/15/14 1356 10/16/14 0740  WBC 5.2 4.9  NEUTROABS 2.7 1.8  HGB 12.8 12.3  HCT 38.7 38.0  MCV 86.2 87.4  PLT 301 299   Cardiac Enzymes: No results for input(s): CKTOTAL, CKMB, CKMBINDEX, TROPONINI in the last 168 hours. BNP: BNP (last 3 results) No results for input(s): BNP in the last 8760 hours.  ProBNP (last 3 results) No results for input(s): PROBNP in the last 8760 hours.  CBG:  Recent Labs Lab 10/15/14 1328 10/16/14 0752  GLUCAP 123* 87       Signed:  Tamina Cyphers L  [facility] 10/21/2014, 9:04 PM

## 2014-11-12 ENCOUNTER — Telehealth: Payer: Self-pay

## 2014-11-12 NOTE — Telephone Encounter (Signed)
PT CALLED STATING SHE HAS CALLED FOR THE LAST 5 WKS TO SEE  IF HER ORTHOTICS ARE IN AND NO ONE HAS CALLED HER BACK. I EXPLAINED WHAT HAD HAPPENED AND SHE SENT HER CONDOLENCES. I EXPLAINED THAT YOU WOULD HAVE TO CHECK AND CALL HER BACK PROBABLY TOMORROW. IF NOT AT HOME CALL CELL.

## 2014-11-13 NOTE — Telephone Encounter (Signed)
Called patient today and spoke about the inserts. Jordan Kim

## 2014-11-22 ENCOUNTER — Ambulatory Visit: Payer: Self-pay | Admitting: Orthopedic Surgery

## 2014-11-30 ENCOUNTER — Encounter (HOSPITAL_COMMUNITY): Payer: Self-pay

## 2014-11-30 ENCOUNTER — Encounter (HOSPITAL_COMMUNITY)
Admission: RE | Admit: 2014-11-30 | Discharge: 2014-11-30 | Disposition: A | Discharge status: Home / Self Care | Payer: Commercial Managed Care - PPO | Source: Ambulatory Visit | Attending: Specialist | Admitting: Specialist

## 2014-11-30 ENCOUNTER — Ambulatory Visit (HOSPITAL_COMMUNITY)
Admission: RE | Admit: 2014-11-30 | Discharge: 2014-11-30 | Disposition: A | Discharge status: Home / Self Care | Payer: Commercial Managed Care - PPO | Source: Ambulatory Visit | Attending: Orthopedic Surgery | Admitting: Orthopedic Surgery

## 2014-11-30 ENCOUNTER — Ambulatory Visit (HOSPITAL_COMMUNITY)
Admission: RE | Admit: 2014-11-30 | Discharge: 2014-11-30 | Disposition: A | Discharge status: Home / Self Care | Payer: Commercial Managed Care - PPO | Source: Ambulatory Visit | Attending: Anesthesiology | Admitting: Anesthesiology

## 2014-11-30 DIAGNOSIS — M5126 Other intervertebral disc displacement, lumbar region: Secondary | ICD-10-CM

## 2014-11-30 DIAGNOSIS — Z01810 Encounter for preprocedural cardiovascular examination: Secondary | ICD-10-CM

## 2014-11-30 DIAGNOSIS — J45909 Unspecified asthma, uncomplicated: Secondary | ICD-10-CM | POA: Diagnosis not present

## 2014-11-30 DIAGNOSIS — Z87891 Personal history of nicotine dependence: Secondary | ICD-10-CM | POA: Diagnosis not present

## 2014-11-30 HISTORY — DX: Reserved for inherently not codable concepts without codable children: IMO0001

## 2014-11-30 HISTORY — DX: Urinary tract infection, site not specified: N39.0

## 2014-11-30 LAB — BASIC METABOLIC PANEL
ANION GAP: 7 (ref 5–15)
BUN: 19 mg/dL (ref 6–20)
CO2: 26 mmol/L (ref 22–32)
CREATININE: 0.62 mg/dL (ref 0.44–1.00)
Calcium: 9.3 mg/dL (ref 8.9–10.3)
Chloride: 105 mmol/L (ref 101–111)
GFR calc non Af Amer: >60 mL/min (ref >60)
Glucose, Bld: 88 mg/dL (ref 65–99)
Potassium: 4.2 mmol/L (ref 3.5–5.1)
SODIUM: 138 mmol/L (ref 135–145)

## 2014-11-30 LAB — CBC
HCT: 38.9 % (ref 36.0–46.0)
HEMOGLOBIN: 12.5 g/dL (ref 12.0–15.0)
MCH: 28 pg (ref 26.0–34.0)
MCHC: 32.1 g/dL (ref 30.0–36.0)
MCV: 87 fL (ref 78.0–100.0)
Platelets: 313 10*3/uL (ref 150–400)
RBC: 4.47 MIL/uL (ref 3.87–5.11)
RDW: 13.3 % (ref 11.5–15.5)
WBC: 5.4 10*3/uL (ref 4.0–10.5)

## 2014-11-30 LAB — HCG, SERUM, QUALITATIVE: Preg, Serum: NEGATIVE

## 2014-11-30 LAB — SURGICAL PCR SCREEN
MRSA, PCR: NEGATIVE
STAPHYLOCOCCUS AUREUS: NEGATIVE

## 2014-11-30 NOTE — Progress Notes (Addendum)
Clearance note per chart 11/02/2014 per Dr Sherral Hammers EKG epic 10/15/2014  ECHO epic 10/16/2014

## 2014-11-30 NOTE — Patient Instructions (Addendum)
Jordan Kim  11/30/2014   Your procedure is scheduled on: Wednesday December 05, 2014   Report to [facility] Main  Entrance and follow signs to               Short Stay Center arrive at 9:30 AM.  Call this number if you have problems the morning of surgery [phone]   Remember: ONLY 1 PERSON MAY GO WITH YOU TO SHORT STAY TO GET  READY MORNING OF YOUR SURGERY.  Do not eat food or drink liquids :After Midnight.     Take these medicines the morning of surgery with A SIP OF WATER: Omeprazole (Prilosec); Albuterol Inhaler if needed; Hydrocodone-Acetaminphen if needed                               You may not have any metal on your body including hair pins and              piercings  Do not wear jewelry, make-up, lotions, powders or perfumes, deodorant             Do not wear nail polish.  Do not shave  48 hours prior to surgery.               Do not bring valuables to the hospital. [facility] IS NOT             RESPONSIBLE   FOR VALUABLES.  Contacts, dentures or bridgework may not be worn into surgery.  Leave suitcase in the car. After surgery it may be brought to your room.                  Please read over the following fact sheets you were given:INCENTIVE SPIROMETER  _____________________________________________________________________             [facility] - Preparing for Surgery Before surgery, you can play an important role.  Because skin is not sterile, your skin needs to be as free of germs as possible.  You can reduce the number of germs on your skin by washing with CHG (chlorahexidine gluconate) soap before surgery.  CHG is an antiseptic cleaner which kills germs and bonds with the skin to continue killing germs even after washing. Please DO NOT use if you have an allergy to CHG or antibacterial soaps.  If your skin becomes reddened/irritated stop using the CHG and inform your nurse when you arrive at Short Stay. Do not shave (including legs and  underarms) for at least 48 hours prior to the first CHG shower.  You may shave your face/neck. Please follow these instructions carefully:  1.  Shower with CHG Soap the night before surgery and the  morning of Surgery.  2.  If you choose to wash your hair, wash your hair first as usual with your  normal  shampoo.  3.  After you shampoo, rinse your hair and body thoroughly to remove the  shampoo.                           4.  Use CHG as you would any other liquid soap.  You can apply chg directly  to the skin and wash  Gently with a scrungie or clean washcloth.  5.  Apply the CHG Soap to your body ONLY FROM THE NECK DOWN.   Do not use on face/ open                           Wound or open sores. Avoid contact with eyes, ears mouth and genitals (private parts).                       Wash face,  Genitals (private parts) with your normal soap.             6.  Wash thoroughly, paying special attention to the area where your surgery  will be performed.  7.  Thoroughly rinse your body with warm water from the neck down.  8.  DO NOT shower/wash with your normal soap after using and rinsing off  the CHG Soap.                9.  Pat yourself dry with a clean towel.            10.  Wear clean pajamas.            11.  Place clean sheets on your bed the night of your first shower and do not  sleep with pets. Day of Surgery : Do not apply any lotions/deodorants the morning of surgery.  Please wear clean clothes to the hospital/surgery center.  FAILURE TO FOLLOW THESE INSTRUCTIONS MAY RESULT IN THE CANCELLATION OF YOUR SURGERY PATIENT SIGNATURE_________________________________  NURSE SIGNATURE__________________________________  ________________________________________________________________________   Jordan Kim  An incentive spirometer is a tool that can help keep your lungs clear and active. This tool measures how well you are filling your lungs with each breath. Taking  long deep breaths may help reverse or decrease the chance of developing breathing (pulmonary) problems (especially infection) following:  A long period of time when you are unable to move or be active. BEFORE THE PROCEDURE   If the spirometer includes an indicator to show your best effort, your nurse or respiratory therapist will set it to a desired goal.  If possible, sit up straight or lean slightly forward. Try not to slouch.  Hold the incentive spirometer in an upright position. INSTRUCTIONS FOR USE  1. Sit on the edge of your bed if possible, or sit up as far as you can in bed or on a chair. 2. Hold the incentive spirometer in an upright position. 3. Breathe out normally. 4. Place the mouthpiece in your mouth and seal your lips tightly around it. 5. Breathe in slowly and as deeply as possible, raising the piston or the ball toward the top of the column. 6. Hold your breath for 3-5 seconds or for as long as possible. Allow the piston or ball to fall to the bottom of the column. 7. Remove the mouthpiece from your mouth and breathe out normally. 8. Rest for a few seconds and repeat Steps 1 through 7 at least 10 times every 1-2 hours when you are awake. Take your time and take a few normal breaths between deep breaths. 9. The spirometer may include an indicator to show your best effort. Use the indicator as a goal to work toward during each repetition. 10. After each set of 10 deep breaths, practice coughing to be sure your lungs are clear. If you have an incision (the cut made at the time of surgery),  support your incision when coughing by placing a pillow or rolled up towels firmly against it. Once you are able to get out of bed, walk around indoors and cough well. You may stop using the incentive spirometer when instructed by your caregiver.  RISKS AND COMPLICATIONS  Take your time so you do not get dizzy or light-headed.  If you are in pain, you may need to take or ask for pain  medication before doing incentive spirometry. It is harder to take a deep breath if you are having pain. AFTER USE  Rest and breathe slowly and easily.  It can be helpful to keep track of a log of your progress. Your caregiver can provide you with a simple table to help with this. If you are using the spirometer at home, follow these instructions: [place] IF:   You are having difficultly using the spirometer.  You have trouble using the spirometer as often as instructed.  Your pain medication is not giving enough relief while using the spirometer.  You develop fever of 100.5 F (38.1 C) or higher. SEEK IMMEDIATE MEDICAL CARE IF:   You cough up bloody sputum that had not been present before.  You develop fever of 102 F (38.9 C) or greater.  You develop worsening pain at or near the incision site. MAKE SURE YOU:   Understand these instructions.  Will watch your condition.  Will get help right away if you are not doing well or get worse. Document Released: 10/19/2006 Document Revised: 08/31/2011 Document Reviewed: 12/20/2006 [facility] Patient Information 2014 [place], [place].   ________________________________________________________________________

## 2014-12-03 ENCOUNTER — Ambulatory Visit: Payer: Self-pay | Admitting: Orthopedic Surgery

## 2014-12-03 NOTE — H&P (Signed)
Jordan Kim is an 43 y.o. female.   Chief Complaint: back and R leg pain HPI: The patient is a 43 year old female who presents today for follow up of their back. The patient is being followed for their low back pain. They are now 1 month(s) out from a flare up. The patient had a syncopal episode and fell, landing on her back. Symptoms reported today include: pain, numbness (and tingling right foot), burning and leg pain (right). Current treatment includes: activity modification, NSAIDs and muscle relaxer. The following medication has been used for pain control: Robaxin, Mobic and Flexeril.  Jordan Kim follows up, taking Mobic and Robaxin. She fell after she passed out. She got worked up by her medical physician and has done fine. She had persistent right lower extremity radicular pain. She had another tumor exacerbations, one last in the fall that we evaluated her for. She had an MRI, which showed a small recurrent disc herniation at 4-5, history of lumbar decompression. She was seen at [facility]. Repeat MRI demonstrates small disc herniation at 4-5, right paracentral herniation. Scar in the L5 nerve root on the right. She has had a prior hemilaminectomy on the left at L5-S1. Degenerative changes were noted on her x-ray.  She reports severe pain. She has tried Robaxin, hydrocodone without avail. She is unable to work. I treated her previously with prednisone in February as well as Titralac. She is having considerable problems at this point.  Past Medical History  Diagnosis Date  . GERD (gastroesophageal reflux disease)   . Syncope and collapse 10/15/2014  . Complication of anesthesia     "very hard for me to come out of it"  . PONV (postoperative nausea and vomiting)   . High cholesterol   . Asthma   . Chronic bronchitis     last episode 08/2014  . Anemia     hx  . Chronic back pain   . Arthritis     "feet knees, hips, back, right shoulder, neck, hands" (10/16/2014)  . DDD  (degenerative disc disease), cervical     "w/bulging & collapsed disc" (10/16/2014)  . DDD (degenerative disc disease), lumbosacral     "herniations S1-S2" (10/16/2014)  . Kidney stones   . Shortness of breath dyspnea     humidity   . Pneumonia     "4-5 times; last time was early April 2016" (10/16/2014)  . Urinary tract infection     08/2014  . Migraine     "1-2/yr now" (10/16/2014)    Past Surgical History  Procedure Laterality Date  . Shoulder arthroscopy w/ rotator cuff repair Right 12/2010  . Back surgery    . Ganglion cyst excision Left 2000's  . Carpal tunnel release Right 2000's  . Knee arthroscopy Bilateral 1987; 1988; 1989    "both; both; both"  . Cesarean section  1985; 1995; 1998  . Lumbar laminectomy/decompression microdiscectomy  2010    L4-5 right and L5-S1 left/notes 10/22/2010    No family history on file. Social History:  reports that she quit smoking about 8 years ago. Her smoking use included Cigarettes. She has a 49.5 pack-year smoking history. She has never used smokeless tobacco. She reports that she drinks alcohol. She reports that she does not use illicit drugs.  Allergies:  Allergies  Allergen Reactions  . Morphine And Related Anaphylaxis and Rash  . Demerol [Meperidine]     Cant remember  . Latex Rash     (Not in a   hospital admission)  No results found for this or any previous visit (from the past 48 hour(s)). No results found.  Review of Systems  Constitutional: Negative.   HENT: Negative.   Eyes: Negative.   Respiratory: Negative.   Cardiovascular: Negative.   Gastrointestinal: Negative.   Genitourinary: Negative.   Musculoskeletal: Positive for back pain.  Skin: Negative.   Neurological: Positive for sensory change and focal weakness.  Psychiatric/Behavioral: Negative.     Last menstrual period 11/24/2014. Physical Exam  Constitutional: She is oriented to person, place, and time. She appears well-developed and well-nourished.  HENT:   Head: Normocephalic and atraumatic.  Eyes: Conjunctivae and EOM are normal. Pupils are equal, round, and reactive to light.  Neck: Normal range of motion. Neck supple.  Cardiovascular: Normal rate and regular rhythm.   Respiratory: Effort normal and breath sounds normal.  GI: Soft. Bowel sounds are normal.  Musculoskeletal:  Severe distress. Mood and affect is appropriate. Walks with an antalgic gait. Her straight leg raise, marked buttock, thigh, and calf pain on the right, exacerbated by dorsal augmentation maneuver; on the left, it is negative. EHL is 4+/5 on the right compared to the left. Pain with extension and flexion in the buttock. Lumbar spine exam reveals no evidence of soft tissue swelling, deformity or skin ecchymosis. On palpation there is no tenderness of the lumbar spine. No flank pain with percussion. The abdomen is soft and nontender. Nontender over the trochanters. No cellulitis or lymphadenopathy.  Good range of motion of the lumbar spine without associated pain. Motor is 5/5 including tibialis anterior, plantar flexion, quadriceps and hamstrings. Patient is normoreflexic. There is no Babinski or clonus. Sensory exam is intact to light touch. Patient has good distal pulses. No DVT. No pain and normal range of motion without instability of the hips, knees and ankles.  Neurological: She is alert and oriented to person, place, and time. She has normal reflexes.  Skin: Skin is warm and dry.  Psychiatric: She has a normal mood and affect.    X-rays demonstrate some mild degenerative changes 4-5, 5-1. No instability on flexion and extension.  Outside MRI reviewed, demonstrates a focal paracentral disc herniation, lateral recess stenosis compressing the 5 root.  Assessment/Plan HNP L4-5 right Refractory L5 radiculopathy, myotome weakness, dermatomal dysesthesias secondary to recurrent disc herniation 4-5. She has had multiple episodes of recurrences. She has tried home exercise,  prednisone, analgesics without avail. She has been unable to work. She declined epidurals or any further treatment. She is quite miserable. We discussed other options of revision, decompression and discectomy at 4-5. I do not feel she needs a concomitant fusion. She has minimal back pain and there is no instability.  I had an extensive discussion of the risks and benefits of the lumbar decompression with the patient including bleeding, infection, damage to neurovascular structures, epidural fibrosis, CSF leak requiring repair. We also discussed increase in pain, adjacent segment disease, recurrent disc herniation, need for future surgery including repeat decompression and/or fusion. We also discussed risks of postoperative hematoma, paralysis, anesthetic complications including DVT, PE, death, cardiopulmonary dysfunction. In addition, the perioperative and postoperative courses were discussed in detail including the rehabilitative time and return to functional activity and work. I provided the patient with an illustrated handout and utilized the appropriate surgical models.  She would like to proceed. We will have her schedule as soon as we can. Give her prescription for hydrocodone to be taken, Robaxin. Still work on her home [employer] exercise programs. If   there are any changes between now and when we can see her, she is to call.  Plan microlumbar decompression L4-5 right  BISSELL, JACLYN M. PA-C for Dr. Beane 12/03/2014, 8:14 AM    

## 2014-12-05 ENCOUNTER — Ambulatory Visit (HOSPITAL_COMMUNITY): Payer: Commercial Managed Care - PPO

## 2014-12-05 ENCOUNTER — Ambulatory Visit (HOSPITAL_COMMUNITY)
Admission: RE | Admit: 2014-12-05 | Discharge: 2014-12-06 | Disposition: A | Discharge status: Home / Self Care | Payer: Commercial Managed Care - PPO | Source: Ambulatory Visit | Attending: Specialist | Admitting: Specialist

## 2014-12-05 ENCOUNTER — Encounter (HOSPITAL_COMMUNITY)
Admission: RE | Disposition: A | Discharge status: Home / Self Care | Payer: Self-pay | Source: Ambulatory Visit | Attending: Specialist

## 2014-12-05 ENCOUNTER — Encounter (HOSPITAL_COMMUNITY): Payer: Self-pay | Admitting: *Deleted

## 2014-12-05 ENCOUNTER — Ambulatory Visit (HOSPITAL_COMMUNITY): Payer: Commercial Managed Care - PPO | Admitting: Anesthesiology

## 2014-12-05 DIAGNOSIS — Z885 Allergy status to narcotic agent status: Secondary | ICD-10-CM | POA: Diagnosis not present

## 2014-12-05 DIAGNOSIS — Z419 Encounter for procedure for purposes other than remedying health state, unspecified: Secondary | ICD-10-CM

## 2014-12-05 DIAGNOSIS — K219 Gastro-esophageal reflux disease without esophagitis: Secondary | ICD-10-CM | POA: Diagnosis not present

## 2014-12-05 DIAGNOSIS — M48061 Spinal stenosis, lumbar region without neurogenic claudication: Secondary | ICD-10-CM | POA: Diagnosis present

## 2014-12-05 DIAGNOSIS — Z91040 Latex allergy status: Secondary | ICD-10-CM | POA: Diagnosis not present

## 2014-12-05 DIAGNOSIS — M1990 Unspecified osteoarthritis, unspecified site: Secondary | ICD-10-CM | POA: Diagnosis not present

## 2014-12-05 DIAGNOSIS — M4806 Spinal stenosis, lumbar region: Secondary | ICD-10-CM | POA: Diagnosis not present

## 2014-12-05 DIAGNOSIS — E780 Pure hypercholesterolemia: Secondary | ICD-10-CM | POA: Insufficient documentation

## 2014-12-05 DIAGNOSIS — M5126 Other intervertebral disc displacement, lumbar region: Secondary | ICD-10-CM | POA: Insufficient documentation

## 2014-12-05 DIAGNOSIS — Z87442 Personal history of urinary calculi: Secondary | ICD-10-CM | POA: Insufficient documentation

## 2014-12-05 DIAGNOSIS — Z87891 Personal history of nicotine dependence: Secondary | ICD-10-CM | POA: Insufficient documentation

## 2014-12-05 DIAGNOSIS — J45909 Unspecified asthma, uncomplicated: Secondary | ICD-10-CM | POA: Insufficient documentation

## 2014-12-05 DIAGNOSIS — M5030 Other cervical disc degeneration, unspecified cervical region: Secondary | ICD-10-CM | POA: Insufficient documentation

## 2014-12-05 HISTORY — PX: LUMBAR LAMINECTOMY/DECOMPRESSION MICRODISCECTOMY: SHX5026

## 2014-12-05 LAB — ABO/RH: ABO/RH(D): O POS

## 2014-12-05 SURGERY — LUMBAR LAMINECTOMY/DECOMPRESSION MICRODISCECTOMY 1 LEVEL
Anesthesia: General | Site: Back | Laterality: Right

## 2014-12-05 MED ORDER — HYDROMORPHONE HCL 1 MG/ML IJ SOLN
0.5000 mg | INTRAMUSCULAR | Status: DC | PRN
  Administered 2014-12-05: 0.5 mg via INTRAVENOUS
  Administered 2014-12-05 (×2): 1 mg via INTRAVENOUS
  Administered 2014-12-05: 0.5 mg via INTRAVENOUS
  Administered 2014-12-06: 1 mg via INTRAVENOUS
  Filled 2014-12-05 (×4): qty 1

## 2014-12-05 MED ORDER — VITAMIN B-12 100 MCG PO TABS
ORAL_TABLET | Freq: Every morning | ORAL | Status: DC
  Administered 2014-12-06: 10:00:00 via ORAL
  Filled 2014-12-05: qty 1

## 2014-12-05 MED ORDER — SENNOSIDES-DOCUSATE SODIUM 8.6-50 MG PO TABS: 1.0000 | ORAL_TABLET | Freq: Every evening | ORAL | Status: DC | PRN

## 2014-12-05 MED ORDER — GLYCOPYRROLATE 0.2 MG/ML IJ SOLN
INTRAMUSCULAR | Status: AC
  Filled 2014-12-05: qty 3

## 2014-12-05 MED ORDER — ROCURONIUM BROMIDE 100 MG/10ML IV SOLN
INTRAVENOUS | Status: DC | PRN
  Administered 2014-12-05: 50 mg via INTRAVENOUS

## 2014-12-05 MED ORDER — KETAMINE HCL 10 MG/ML IJ SOLN
INTRAMUSCULAR | Status: AC
  Filled 2014-12-05: qty 1

## 2014-12-05 MED ORDER — ONDANSETRON HCL 4 MG/2ML IJ SOLN
INTRAMUSCULAR | Status: DC | PRN
  Administered 2014-12-05: 4 mg via INTRAVENOUS

## 2014-12-05 MED ORDER — SACCHAROMYCES BOULARDII 250 MG PO CAPS
250.0000 mg | ORAL_CAPSULE | Freq: Two times a day (BID) | ORAL | Status: DC
  Administered 2014-12-05 – 2014-12-06 (×2): 250 mg via ORAL
  Filled 2014-12-05 (×3): qty 1

## 2014-12-05 MED ORDER — ROCURONIUM BROMIDE 100 MG/10ML IV SOLN
INTRAVENOUS | Status: AC
  Filled 2014-12-05: qty 1

## 2014-12-05 MED ORDER — ONDANSETRON HCL 4 MG/2ML IJ SOLN: 4.0000 mg | Freq: Once | INTRAMUSCULAR | Status: DC | PRN

## 2014-12-05 MED ORDER — SODIUM CHLORIDE 0.9 % IR SOLN
Status: DC | PRN
  Administered 2014-12-05: 500 mL

## 2014-12-05 MED ORDER — LIDOCAINE HCL (CARDIAC) 20 MG/ML IV SOLN
INTRAVENOUS | Status: AC
  Filled 2014-12-05: qty 5

## 2014-12-05 MED ORDER — METHOCARBAMOL 1000 MG/10ML IJ SOLN
500.0000 mg | Freq: Four times a day (QID) | INTRAMUSCULAR | Status: DC | PRN
  Administered 2014-12-05: 500 mg via INTRAVENOUS
  Filled 2014-12-05 (×3): qty 5

## 2014-12-05 MED ORDER — GELATIN ABSORBABLE MT POWD
OROMUCOSAL | Status: DC | PRN
  Administered 2014-12-05: 12:00:00 via TOPICAL

## 2014-12-05 MED ORDER — THROMBIN 5000 UNITS EX SOLR
CUTANEOUS | Status: AC
  Filled 2014-12-05: qty 10000

## 2014-12-05 MED ORDER — KETAMINE HCL 10 MG/ML IJ SOLN
INTRAMUSCULAR | Status: DC | PRN
  Administered 2014-12-05: 30 mg via INTRAVENOUS

## 2014-12-05 MED ORDER — MIDAZOLAM HCL 2 MG/2ML IJ SOLN
INTRAMUSCULAR | Status: AC
  Filled 2014-12-05: qty 2

## 2014-12-05 MED ORDER — HYDROMORPHONE HCL 2 MG/ML IJ SOLN
INTRAMUSCULAR | Status: AC
  Filled 2014-12-05: qty 1

## 2014-12-05 MED ORDER — MIDAZOLAM HCL 5 MG/5ML IJ SOLN
INTRAMUSCULAR | Status: DC | PRN
  Administered 2014-12-05: 2 mg via INTRAVENOUS

## 2014-12-05 MED ORDER — METHOCARBAMOL 500 MG PO TABS
500.0000 mg | ORAL_TABLET | Freq: Four times a day (QID) | ORAL | Status: DC | PRN
  Administered 2014-12-06: 500 mg via ORAL
  Filled 2014-12-05 (×2): qty 1

## 2014-12-05 MED ORDER — MENTHOL 3 MG MT LOZG
1.0000 | LOZENGE | OROMUCOSAL | Status: DC | PRN
  Administered 2014-12-05: 3 mg via ORAL
  Filled 2014-12-05: qty 9

## 2014-12-05 MED ORDER — OXYCODONE-ACETAMINOPHEN 5-325 MG PO TABS
1.0000 | ORAL_TABLET | ORAL | Status: DC | PRN
  Administered 2014-12-05: 1 via ORAL
  Administered 2014-12-06 (×3): 2 via ORAL
  Filled 2014-12-05: qty 2
  Filled 2014-12-05: qty 1
  Filled 2014-12-05 (×2): qty 2

## 2014-12-05 MED ORDER — OXYCODONE-ACETAMINOPHEN 5-325 MG PO TABS: 1.0000 | ORAL_TABLET | ORAL | Status: DC | PRN

## 2014-12-05 MED ORDER — CEFAZOLIN SODIUM-DEXTROSE 2-3 GM-% IV SOLR
2.0000 g | INTRAVENOUS | Status: AC
  Administered 2014-12-05: 2 g via INTRAVENOUS

## 2014-12-05 MED ORDER — DOCUSATE SODIUM 100 MG PO CAPS: 100.0000 mg | ORAL_CAPSULE | Freq: Two times a day (BID) | ORAL | Status: AC | PRN

## 2014-12-05 MED ORDER — ALUM & MAG HYDROXIDE-SIMETH 200-200-20 MG/5ML PO SUSP: 30.0000 mL | Freq: Four times a day (QID) | ORAL | Status: DC | PRN

## 2014-12-05 MED ORDER — ONDANSETRON HCL 4 MG/2ML IJ SOLN: 4.0000 mg | INTRAMUSCULAR | Status: DC | PRN

## 2014-12-05 MED ORDER — MAGNESIUM CITRATE PO SOLN: 1.0000 | Freq: Once | ORAL | Status: AC | PRN

## 2014-12-05 MED ORDER — PROPOFOL 10 MG/ML IV BOLUS
INTRAVENOUS | Status: AC
  Filled 2014-12-05: qty 20

## 2014-12-05 MED ORDER — BISACODYL 5 MG PO TBEC: 5.0000 mg | DELAYED_RELEASE_TABLET | Freq: Every day | ORAL | Status: DC | PRN

## 2014-12-05 MED ORDER — GLYCOPYRROLATE 0.2 MG/ML IJ SOLN
INTRAMUSCULAR | Status: DC | PRN
  Administered 2014-12-05: 0.6 mg via INTRAVENOUS

## 2014-12-05 MED ORDER — ONDANSETRON HCL 4 MG/2ML IJ SOLN
INTRAMUSCULAR | Status: AC
  Filled 2014-12-05: qty 2

## 2014-12-05 MED ORDER — NEOSTIGMINE METHYLSULFATE 10 MG/10ML IV SOLN
INTRAVENOUS | Status: DC | PRN
  Administered 2014-12-05: 4 mg via INTRAVENOUS

## 2014-12-05 MED ORDER — ACETAMINOPHEN 325 MG PO TABS: 650.0000 mg | ORAL_TABLET | ORAL | Status: DC | PRN

## 2014-12-05 MED ORDER — LACTATED RINGERS IV SOLN: INTRAVENOUS | Status: DC | PRN

## 2014-12-05 MED ORDER — PSYLLIUM 95 % PO PACK
1.0000 | PACK | ORAL | Status: DC
Start: 2014-12-06 — End: 2014-12-06
  Administered 2014-12-06: 1 via ORAL
  Filled 2014-12-05: qty 1

## 2014-12-05 MED ORDER — ACETAMINOPHEN 10 MG/ML IV SOLN
1000.0000 mg | Freq: Once | INTRAVENOUS | Status: AC
  Administered 2014-12-05: 1000 mg via INTRAVENOUS

## 2014-12-05 MED ORDER — FENTANYL CITRATE (PF) 100 MCG/2ML IJ SOLN
25.0000 ug | INTRAMUSCULAR | Status: DC | PRN
  Administered 2014-12-05: 50 ug via INTRAVENOUS

## 2014-12-05 MED ORDER — ACETAMINOPHEN 10 MG/ML IV SOLN
INTRAVENOUS | Status: AC
  Filled 2014-12-05: qty 100

## 2014-12-05 MED ORDER — DEXAMETHASONE SODIUM PHOSPHATE 10 MG/ML IJ SOLN
INTRAMUSCULAR | Status: AC
  Filled 2014-12-05: qty 1

## 2014-12-05 MED ORDER — KCL IN DEXTROSE-NACL 20-5-0.45 MEQ/L-%-% IV SOLN
INTRAVENOUS | Status: DC
  Administered 2014-12-05: 14:00:00 via INTRAVENOUS
  Filled 2014-12-05 (×2): qty 1000

## 2014-12-05 MED ORDER — PANTOPRAZOLE SODIUM 40 MG PO TBEC
40.0000 mg | DELAYED_RELEASE_TABLET | Freq: Every day | ORAL | Status: DC
  Administered 2014-12-06: 40 mg via ORAL
  Filled 2014-12-05: qty 1

## 2014-12-05 MED ORDER — BUPIVACAINE-EPINEPHRINE 0.5% -1:200000 IJ SOLN
INTRAMUSCULAR | Status: DC | PRN
  Administered 2014-12-05: 4 mL

## 2014-12-05 MED ORDER — PHENOL 1.4 % MT LIQD: 1.0000 | OROMUCOSAL | Status: DC | PRN

## 2014-12-05 MED ORDER — SODIUM CHLORIDE 0.9 % IR SOLN
Status: AC
  Filled 2014-12-05: qty 1

## 2014-12-05 MED ORDER — DOCUSATE SODIUM 100 MG PO CAPS
100.0000 mg | ORAL_CAPSULE | Freq: Two times a day (BID) | ORAL | Status: DC
  Administered 2014-12-05 – 2014-12-06 (×2): 100 mg via ORAL

## 2014-12-05 MED ORDER — DEXAMETHASONE SODIUM PHOSPHATE 10 MG/ML IJ SOLN
INTRAMUSCULAR | Status: DC | PRN
  Administered 2014-12-05: 10 mg via INTRAVENOUS

## 2014-12-05 MED ORDER — HYDROMORPHONE HCL 1 MG/ML IJ SOLN
INTRAMUSCULAR | Status: DC | PRN
Start: 2014-12-05 — End: 2014-12-05
  Administered 2014-12-05: .2 mg via INTRAVENOUS

## 2014-12-05 MED ORDER — ALBUTEROL SULFATE (2.5 MG/3ML) 0.083% IN NEBU: 3.0000 mL | INHALATION_SOLUTION | RESPIRATORY_TRACT | Status: DC | PRN

## 2014-12-05 MED ORDER — LIDOCAINE HCL (CARDIAC) 20 MG/ML IV SOLN
INTRAVENOUS | Status: DC | PRN
  Administered 2014-12-05: 100 mg via INTRAVENOUS

## 2014-12-05 MED ORDER — FENTANYL CITRATE (PF) 250 MCG/5ML IJ SOLN
INTRAMUSCULAR | Status: AC
  Filled 2014-12-05: qty 5

## 2014-12-05 MED ORDER — PRO-BIOTIC BLEND PO CAPS: ORAL_CAPSULE | Freq: Every morning | ORAL | Status: DC

## 2014-12-05 MED ORDER — MECLIZINE HCL 25 MG PO TABS
25.0000 mg | ORAL_TABLET | Freq: Three times a day (TID) | ORAL | Status: DC | PRN
  Filled 2014-12-05: qty 1

## 2014-12-05 MED ORDER — BUPIVACAINE-EPINEPHRINE (PF) 0.5% -1:200000 IJ SOLN
INTRAMUSCULAR | Status: AC
  Filled 2014-12-05: qty 30

## 2014-12-05 MED ORDER — PROPOFOL 10 MG/ML IV BOLUS
INTRAVENOUS | Status: DC | PRN
  Administered 2014-12-05: 200 mg via INTRAVENOUS

## 2014-12-05 MED ORDER — CEFAZOLIN SODIUM-DEXTROSE 2-3 GM-% IV SOLR
2.0000 g | Freq: Three times a day (TID) | INTRAVENOUS | Status: AC
  Administered 2014-12-05 – 2014-12-06 (×3): 2 g via INTRAVENOUS
  Filled 2014-12-05 (×3): qty 50

## 2014-12-05 MED ORDER — METHOCARBAMOL 500 MG PO TABS: 500.0000 mg | ORAL_TABLET | Freq: Three times a day (TID) | ORAL | Status: AC

## 2014-12-05 MED ORDER — FENTANYL CITRATE (PF) 250 MCG/5ML IJ SOLN
INTRAMUSCULAR | Status: DC | PRN
  Administered 2014-12-05 (×2): 100 ug via INTRAVENOUS
  Administered 2014-12-05: 50 ug via INTRAVENOUS

## 2014-12-05 MED ORDER — HYDROCODONE-ACETAMINOPHEN 5-325 MG PO TABS
1.0000 | ORAL_TABLET | ORAL | Status: DC | PRN
  Administered 2014-12-05: 2 via ORAL
  Filled 2014-12-05: qty 2

## 2014-12-05 MED ORDER — LACTATED RINGERS IV SOLN
INTRAVENOUS | Status: DC
  Administered 2014-12-05 (×2): via INTRAVENOUS

## 2014-12-05 MED ORDER — ACETAMINOPHEN 650 MG RE SUPP: 650.0000 mg | RECTAL | Status: DC | PRN

## 2014-12-05 MED ORDER — FENTANYL CITRATE (PF) 100 MCG/2ML IJ SOLN
INTRAMUSCULAR | Status: AC
  Filled 2014-12-05: qty 2

## 2014-12-05 MED ORDER — CEFAZOLIN SODIUM-DEXTROSE 2-3 GM-% IV SOLR
INTRAVENOUS | Status: AC
  Filled 2014-12-05: qty 50

## 2014-12-05 SURGICAL SUPPLY — 49 items
BAG ZIPLOCK 12X15 (MISCELLANEOUS) IMPLANT
CATH FOLEY LATEX FREE 16FR (CATHETERS) ×2 IMPLANT
CLEANER TIP ELECTROSURG 2X2 (MISCELLANEOUS) ×2 IMPLANT
CLOTH 2% CHLOROHEXIDINE 3PK (PERSONAL CARE ITEMS) ×2 IMPLANT
DRAPE MICROSCOPE LEICA (MISCELLANEOUS) ×2 IMPLANT
DRAPE POUCH INSTRU U-SHP 10X18 (DRAPES) ×2 IMPLANT
DRAPE SHEET LG 3/4 BI-LAMINATE (DRAPES) ×2 IMPLANT
DRAPE SURG 17X11 SM STRL (DRAPES) ×2 IMPLANT
DRAPE UTILITY XL STRL (DRAPES) ×2 IMPLANT
DRSG AQUACEL AG ADV 3.5X 4 (GAUZE/BANDAGES/DRESSINGS) ×2 IMPLANT
DRSG AQUACEL AG ADV 3.5X 6 (GAUZE/BANDAGES/DRESSINGS) IMPLANT
DURAPREP 26ML APPLICATOR (WOUND CARE) ×2 IMPLANT
DURASEAL SPINE SEALANT 3ML (MISCELLANEOUS) IMPLANT
ELECT BLADE TIP CTD 4 INCH (ELECTRODE) ×2 IMPLANT
ELECT REM PT RETURN 9FT ADLT (ELECTROSURGICAL) ×2
ELECTRODE REM PT RTRN 9FT ADLT (ELECTROSURGICAL) ×1 IMPLANT
GLOVE BIOGEL PI IND STRL 6.5 (GLOVE) ×2 IMPLANT
GLOVE BIOGEL PI IND STRL 7.5 (GLOVE) ×1 IMPLANT
GLOVE BIOGEL PI INDICATOR 6.5 (GLOVE) ×2
GLOVE BIOGEL PI INDICATOR 7.5 (GLOVE) ×1
GLOVE SURG SS PI 6.5 STRL IVOR (GLOVE) ×4 IMPLANT
GLOVE SURG SS PI 7.5 STRL IVOR (GLOVE) ×2 IMPLANT
GLOVE SURG SS PI 8.0 STRL IVOR (GLOVE) ×4 IMPLANT
GOWN STRL REUS W/TWL LRG LVL3 (GOWN DISPOSABLE) ×4 IMPLANT
GOWN STRL REUS W/TWL XL LVL3 (GOWN DISPOSABLE) ×4 IMPLANT
IV CATH 14GX2 1/4 (CATHETERS) ×2 IMPLANT
KIT BASIN OR (CUSTOM PROCEDURE TRAY) ×2 IMPLANT
KIT POSITIONING SURG ANDREWS (MISCELLANEOUS) ×2 IMPLANT
MANIFOLD NEPTUNE II (INSTRUMENTS) ×2 IMPLANT
NEEDLE SPNL 18GX3.5 QUINCKE PK (NEEDLE) ×4 IMPLANT
PACK LAMINECTOMY ORTHO (CUSTOM PROCEDURE TRAY) ×2 IMPLANT
PATTIES SURGICAL .5 X.5 (GAUZE/BANDAGES/DRESSINGS) IMPLANT
PATTIES SURGICAL .75X.75 (GAUZE/BANDAGES/DRESSINGS) ×2 IMPLANT
PATTIES SURGICAL 1X1 (DISPOSABLE) IMPLANT
SPONGE SURGIFOAM ABS GEL 100 (HEMOSTASIS) ×2 IMPLANT
STAPLER VISISTAT (STAPLE) IMPLANT
STRIP CLOSURE SKIN 1/2X4 (GAUZE/BANDAGES/DRESSINGS) ×2 IMPLANT
SUT NURALON 4 0 TR CR/8 (SUTURE) IMPLANT
SUT PROLENE 3 0 PS 2 (SUTURE) ×2 IMPLANT
SUT VIC AB 1 CT1 27 (SUTURE)
SUT VIC AB 1 CT1 27XBRD ANTBC (SUTURE) IMPLANT
SUT VIC AB 1-0 CT2 27 (SUTURE) ×2 IMPLANT
SUT VIC AB 2-0 CT1 27 (SUTURE)
SUT VIC AB 2-0 CT1 TAPERPNT 27 (SUTURE) IMPLANT
SUT VIC AB 2-0 CT2 27 (SUTURE) ×2 IMPLANT
SYR 3ML LL SCALE MARK (SYRINGE) ×2 IMPLANT
TOWEL OR 17X26 10 PK STRL BLUE (TOWEL DISPOSABLE) ×2 IMPLANT
TOWEL OR NON WOVEN STRL DISP B (DISPOSABLE) ×2 IMPLANT
YANKAUER SUCT BULB TIP NO VENT (SUCTIONS) ×2 IMPLANT

## 2014-12-05 NOTE — Interval H&P Note (Signed)
History and Physical Interval Note:  12/05/2014 8:02 AM  Jordan Kim  has presented today for surgery, with the diagnosis of HNP  The various methods of treatment have been discussed with the patient and family. After consideration of risks, benefits and other options for treatment, the patient has consented to  Procedure(s): REVISION MICRO LUMBAR DECOMPRESSION L4-L5 RIGHT  (1 LEVEL) (Right) as a surgical intervention .  The patient's history has been reviewed, patient examined, no change in status, stable for surgery.  I have reviewed the patient's chart and labs.  Questions were answered to the patient's satisfaction.     Ahmari Duerson C

## 2014-12-05 NOTE — Transfer of Care (Signed)
Immediate Anesthesia Transfer of Care Note  Patient: Jordan Kim  Procedure(s) Performed: Procedure(s): REVISION MICRO LUMBAR DECOMPRESSION, MICRODISCECTOMY L4-L5 RIGHT (Right)  Patient Location: PACU  Anesthesia Type:General  Level of Consciousness: awake, alert , oriented and patient cooperative  Airway & Oxygen Therapy: Patient Spontanous Breathing and Patient connected to face mask oxygen  Post-op Assessment: Report given to RN and Post -op Vital signs reviewed and stable  Post vital signs: Reviewed and stable  Last Vitals:  Filed Vitals:   12/05/14 0926  BP: 127/75  Pulse: 76  Temp: 36.7 C  Resp: 16    Complications: No apparent anesthesia complications

## 2014-12-05 NOTE — Anesthesia Preprocedure Evaluation (Signed)
Anesthesia Evaluation  Patient identified by MRN, date of birth, ID band Patient awake    Reviewed: Allergy & Precautions, NPO status , Patient's Chart, lab work & pertinent test results  History of Anesthesia Complications (+) PONV and history of anesthetic complications  Airway Mallampati: II  TM Distance: >3 FB Neck ROM: Full    Dental no notable dental hx. (+) Dental Advisory Given   Pulmonary asthma , former smoker,  breath sounds clear to auscultation  Pulmonary exam normal       Cardiovascular negative cardio ROS Normal cardiovascular examRhythm:Regular Rate:Normal     Neuro/Psych  Headaches, negative psych ROS   GI/Hepatic Neg liver ROS, GERD-  Medicated and Controlled,  Endo/Other  negative endocrine ROS  Renal/GU negative Renal ROS  negative genitourinary   Musculoskeletal  (+) Arthritis -, Osteoarthritis,    Abdominal   Peds negative pediatric ROS (+)  Hematology negative hematology ROS (+)   Anesthesia Other Findings   Reproductive/Obstetrics negative OB ROS                             Anesthesia Physical Anesthesia Plan  ASA: II  Anesthesia Plan: General   Post-op Pain Management:    Induction: Intravenous  Airway Management Planned:   Additional Equipment:   Intra-op Plan:   Post-operative Plan: Extubation in OR  Informed Consent: I have reviewed the patients History and Physical, chart, labs and discussed the procedure including the risks, benefits and alternatives for the proposed anesthesia with the patient or authorized representative who has indicated his/her understanding and acceptance.   Dental advisory given  Plan Discussed with: CRNA  Anesthesia Plan Comments: (Patient reports that she has had dilaudid several times in the past with no problems. She is alittle unsure about her reaction to morphine when she was 13 during a C/S. Reports that she was  told she had some swelling. She has also had fentanyl before with no problems. )        Anesthesia Quick Evaluation

## 2014-12-05 NOTE — H&P (View-Only) (Signed)
Jordan Kim is an 44 y.o. female.   Chief Complaint: back and R leg pain HPI: The patient is a 44 year old female who presents today for follow up of their back. The patient is being followed for their low back pain. They are now 1 month(s) out from a flare up. The patient had a syncopal episode and fell, landing on her back. Symptoms reported today include: pain, numbness (and tingling right foot), burning and leg pain (right). Current treatment includes: activity modification, NSAIDs and muscle relaxer. The following medication has been used for pain control: Robaxin, Mobic and Flexeril.  Jordan Kim follows up, taking Mobic and Robaxin. She fell after she passed out. She got worked up by her medical physician and has done fine. She had persistent right lower extremity radicular pain. She had another tumor exacerbations, one last in the fall that we evaluated her for. She had an MRI, which showed a small recurrent disc herniation at 4-5, history of lumbar decompression. She was seen at [facility]. Repeat MRI demonstrates small disc herniation at 4-5, right paracentral herniation. Scar in the L5 nerve root on the right. She has had a prior hemilaminectomy on the left at L5-S1. Degenerative changes were noted on her x-ray.  She reports severe pain. She has tried Robaxin, hydrocodone without avail. She is unable to work. I treated her previously with prednisone in February as well as Titralac. She is having considerable problems at this point.  Past Medical History  Diagnosis Date  . GERD (gastroesophageal reflux disease)   . Syncope and collapse 10/15/2014  . Complication of anesthesia     "very hard for me to come out of it"  . PONV (postoperative nausea and vomiting)   . High cholesterol   . Asthma   . Chronic bronchitis     last episode 08/2014  . Anemia     hx  . Chronic back pain   . Arthritis     "feet knees, hips, back, right shoulder, neck, hands" (10/16/2014)  . DDD  (degenerative disc disease), cervical     "w/bulging & collapsed disc" (10/16/2014)  . DDD (degenerative disc disease), lumbosacral     "herniations S1-S2" (10/16/2014)  . Kidney stones   . Shortness of breath dyspnea     humidity   . Pneumonia     "4-5 times; last time was early April 2016" (10/16/2014)  . Urinary tract infection     08/2014  . Migraine     "1-2/yr now" (10/16/2014)    Past Surgical History  Procedure Laterality Date  . Shoulder arthroscopy w/ rotator cuff repair Right 12/2010  . Back surgery    . Ganglion cyst excision Left 2000's  . Carpal tunnel release Right 2000's  . Knee arthroscopy Bilateral 1987; 1988; 1989    "both; both; both"  . Cesarean section  1985; 1995; 1998  . Lumbar laminectomy/decompression microdiscectomy  2010    L4-5 right and L5-S1 left/notes 10/22/2010    No family history on file. Social History:  reports that she quit smoking about 8 years ago. Her smoking use included Cigarettes. She has a 49.5 pack-year smoking history. She has never used smokeless tobacco. She reports that she drinks alcohol. She reports that she does not use illicit drugs.  Allergies:  Allergies  Allergen Reactions  . Morphine And Related Anaphylaxis and Rash  . Demerol [Meperidine]     Cant remember  . Latex Rash     (Not in a  hospital admission)  No results found for this or any previous visit (from the past 48 hour(s)). No results found.  Review of Systems  Constitutional: Negative.   HENT: Negative.   Eyes: Negative.   Respiratory: Negative.   Cardiovascular: Negative.   Gastrointestinal: Negative.   Genitourinary: Negative.   Musculoskeletal: Positive for back pain.  Skin: Negative.   Neurological: Positive for sensory change and focal weakness.  Psychiatric/Behavioral: Negative.     Last menstrual period 11/24/2014. Physical Exam  Constitutional: She is oriented to person, place, and time. She appears well-developed and well-nourished.  HENT:   Head: Normocephalic and atraumatic.  Eyes: Conjunctivae and EOM are normal. Pupils are equal, round, and reactive to light.  Neck: Normal range of motion. Neck supple.  Cardiovascular: Normal rate and regular rhythm.   Respiratory: Effort normal and breath sounds normal.  GI: Soft. Bowel sounds are normal.  Musculoskeletal:  Severe distress. Mood and affect is appropriate. Walks with an antalgic gait. Her straight leg raise, marked buttock, thigh, and calf pain on the right, exacerbated by dorsal augmentation maneuver; on the left, it is negative. EHL is 4+/5 on the right compared to the left. Pain with extension and flexion in the buttock. Lumbar spine exam reveals no evidence of soft tissue swelling, deformity or skin ecchymosis. On palpation there is no tenderness of the lumbar spine. No flank pain with percussion. The abdomen is soft and nontender. Nontender over the trochanters. No cellulitis or lymphadenopathy.  Good range of motion of the lumbar spine without associated pain. Motor is 5/5 including tibialis anterior, plantar flexion, quadriceps and hamstrings. Patient is normoreflexic. There is no Babinski or clonus. Sensory exam is intact to light touch. Patient has good distal pulses. No DVT. No pain and normal range of motion without instability of the hips, knees and ankles.  Neurological: She is alert and oriented to person, place, and time. She has normal reflexes.  Skin: Skin is warm and dry.  Psychiatric: She has a normal mood and affect.    X-rays demonstrate some mild degenerative changes 4-5, 5-1. No instability on flexion and extension.  Outside MRI reviewed, demonstrates a focal paracentral disc herniation, lateral recess stenosis compressing the 5 root.  Assessment/Plan HNP L4-5 right Refractory L5 radiculopathy, myotome weakness, dermatomal dysesthesias secondary to recurrent disc herniation 4-5. She has had multiple episodes of recurrences. She has tried home exercise,  prednisone, analgesics without avail. She has been unable to work. She declined epidurals or any further treatment. She is quite miserable. We discussed other options of revision, decompression and discectomy at 4-5. I do not feel she needs a concomitant fusion. She has minimal back pain and there is no instability.  I had an extensive discussion of the risks and benefits of the lumbar decompression with the patient including bleeding, infection, damage to neurovascular structures, epidural fibrosis, CSF leak requiring repair. We also discussed increase in pain, adjacent segment disease, recurrent disc herniation, need for future surgery including repeat decompression and/or fusion. We also discussed risks of postoperative hematoma, paralysis, anesthetic complications including DVT, PE, death, cardiopulmonary dysfunction. In addition, the perioperative and postoperative courses were discussed in detail including the rehabilitative time and return to functional activity and work. I provided the patient with an illustrated handout and utilized the appropriate surgical models.  She would like to proceed. We will have her schedule as soon as we can. Give her prescription for hydrocodone to be taken, Robaxin. Still work on her home [employer]. If  there are any changes between now and when we can see her, she is to call.  Plan microlumbar decompression L4-5 right  BISSELL, JACLYN M. PA-C for Dr. Shelle Iron 12/03/2014, 8:14 AM

## 2014-12-05 NOTE — Anesthesia Procedure Notes (Signed)
Procedure Name: Intubation Date/Time: 12/05/2014 11:24 AM Performed by: Delphia Grates Pre-anesthesia Checklist: Patient identified, Emergency Drugs available, Suction available and Patient being monitored Patient Re-evaluated:Patient Re-evaluated prior to inductionOxygen Delivery Method: Circle system utilized Preoxygenation: Pre-oxygenation with 100% oxygen Intubation Type: IV induction Ventilation: Mask ventilation without difficulty Laryngoscope Size: Miller and 3 Grade View: Grade I Tube type: Oral Tube size: 7.5 mm Number of attempts: 1 Airway Equipment and Method: Stylet Placement Confirmation: ETT inserted through vocal cords under direct vision,  breath sounds checked- equal and bilateral and positive ETCO2 Secured at: 22 cm Tube secured with: Tape Dental Injury: Teeth and Oropharynx as per pre-operative assessment

## 2014-12-05 NOTE — Anesthesia Postprocedure Evaluation (Signed)
  Anesthesia Post-op Note  Patient: Jordan Kim  Procedure(s) Performed: Procedure(s) (LRB): REVISION MICRO LUMBAR DECOMPRESSION, MICRODISCECTOMY L4-L5 RIGHT (Right)  Patient Location: PACU  Anesthesia Type: General  Level of Consciousness: awake and alert   Airway and Oxygen Therapy: Patient Spontanous Breathing  Post-op Pain: mild  Post-op Assessment: Post-op Vital signs reviewed, Patient's Cardiovascular Status Stable, Respiratory Function Stable, Patent Airway and No signs of Nausea or vomiting  Last Vitals:  Filed Vitals:   12/05/14 1422  BP: 128/66  Pulse: 67  Temp: 36.8 C  Resp: 14    Post-op Vital Signs: stable   Complications: No apparent anesthesia complications

## 2014-12-05 NOTE — Discharge Instructions (Signed)

## 2014-12-05 NOTE — Brief Op Note (Signed)
12/05/2014  1:10 PM  PATIENT:  Jordan Kim  44 y.o. female  PRE-OPERATIVE DIAGNOSIS:  HERNIATED NUCLEUS PULPOSIS L4-L5  POST-OPERATIVE DIAGNOSIS:  HERNIATED NUCLEUS PULPOSIS L4-L5  PROCEDURE:  Procedure(s): REVISION MICRO LUMBAR DECOMPRESSION, MICRODISCECTOMY L4-L5 RIGHT (Right)  SURGEON:  Surgeon(s) and Role:    * Jene Every, MD - Primary  PHYSICIAN ASSISTANT:   ASSISTANTS: Bissell   ANESTHESIA:   general  EBL:  Total I/O In: 1000 [I.V.:1000] Out: 425 [Urine:375; Blood:50]  BLOOD ADMINISTERED:none  DRAINS: none   LOCAL MEDICATIONS USED:  MARCAINE     SPECIMEN:  Source of Specimen:  [id number]  DISPOSITION OF SPECIMEN:  PATHOLOGY  COUNTS:  YES  TOURNIQUET:  * No tourniquets in log *  DICTATION: .Other Dictation: Dictation Number [id number]  PLAN OF CARE: Admit for overnight observation  PATIENT DISPOSITION:  PACU - hemodynamically stable.   Delay start of Pharmacological VTE agent (>24hrs) due to surgical blood loss or risk of bleeding: yes

## 2014-12-06 ENCOUNTER — Encounter (HOSPITAL_COMMUNITY): Payer: Self-pay | Admitting: Specialist

## 2014-12-06 DIAGNOSIS — M5126 Other intervertebral disc displacement, lumbar region: Secondary | ICD-10-CM | POA: Diagnosis not present

## 2014-12-06 MED ORDER — MELOXICAM 15 MG PO TABS: 15.0000 mg | ORAL_TABLET | Freq: Every morning | ORAL | Status: DC

## 2014-12-06 NOTE — Discharge Summary (Signed)
Physician Discharge Summary   Patient ID: Jordan Kim MRN: [id number] DOB/AGE: Nov 18, 1970 44 y.o.  Admit date: 12/05/2014 Discharge date: 12/06/2014  Primary Diagnosis:   HERNIATED NUCLEUS PULPOSIS L4-L5  Admission Diagnoses:  Past Medical History  Diagnosis Date  . GERD (gastroesophageal reflux disease)   . Syncope and collapse 10/15/2014  . Complication of anesthesia     "very hard for me to come out of it"  . PONV (postoperative nausea and vomiting)   . High cholesterol   . Asthma   . Chronic bronchitis     last episode 08/2014  . Anemia     hx  . Chronic back pain   . Arthritis     "feet knees, hips, back, right shoulder, neck, hands" (10/16/2014)  . DDD (degenerative disc disease), cervical     "w/bulging & collapsed disc" (10/16/2014)  . DDD (degenerative disc disease), lumbosacral     "herniations S1-S2" (10/16/2014)  . Kidney stones   . Shortness of breath dyspnea     humidity   . Pneumonia     "4-5 times; last time was early April 2016" (10/16/2014)  . Urinary tract infection     08/2014  . Migraine     "1-2/yr now" (10/16/2014)   Discharge Diagnoses:   Principal Problem:   HNP (herniated nucleus pulposus), lumbar Active Problems:   Spinal stenosis at L4-L5 level  Procedure:  Procedure(s) (LRB): REVISION MICRO LUMBAR DECOMPRESSION, MICRODISCECTOMY L4-L5 RIGHT (Right)   Consults: None  HPI:  see H&P    Laboratory Data: Hospital Outpatient Visit on 11/30/2014  Component Date Value Ref Range Status  . MRSA, PCR 11/30/2014 NEGATIVE  NEGATIVE Final  . Staphylococcus aureus 11/30/2014 NEGATIVE  NEGATIVE Final   Comment:        The Xpert SA Assay (FDA approved for NASAL specimens in patients over 48 years of age), is one component of a comprehensive surveillance program.  Test performance has been validated by [facility] for patients greater than or equal to 32 year old. It is not intended to diagnose infection nor to guide or monitor  treatment.   . Sodium 11/30/2014 138  135 - 145 mmol/L Final  . Potassium 11/30/2014 4.2  3.5 - 5.1 mmol/L Final  . Chloride 11/30/2014 105  101 - 111 mmol/L Final  . CO2 11/30/2014 26  22 - 32 mmol/L Final  . Glucose, Bld 11/30/2014 88  65 - 99 mg/dL Final  . BUN 11/30/2014 19  6 - 20 mg/dL Final  . Creatinine, Ser 11/30/2014 0.62  0.44 - 1.00 mg/dL Final  . Calcium 11/30/2014 9.3  8.9 - 10.3 mg/dL Final  . GFR calc non Af Amer 11/30/2014 >60  >60 mL/min Final  . GFR calc Af Amer 11/30/2014 >60  >60 mL/min Final   Comment: (NOTE) The eGFR has been calculated using the CKD EPI equation. This calculation has not been validated in all clinical situations. eGFR's persistently <60 mL/min signify possible Chronic Kidney Disease.   . Anion gap 11/30/2014 7  5 - 15 Final  . WBC 11/30/2014 5.4  4.0 - 10.5 K/uL Final  . RBC 11/30/2014 4.47  3.87 - 5.11 MIL/uL Final  . Hemoglobin 11/30/2014 12.5  12.0 - 15.0 g/dL Final  . HCT 11/30/2014 38.9  36.0 - 46.0 % Final  . MCV 11/30/2014 87.0  78.0 - 100.0 fL Final  . MCH 11/30/2014 28.0  26.0 - 34.0 pg Final  . MCHC 11/30/2014 32.1  30.0 - 36.0 g/dL Final  .  RDW 11/30/2014 13.3  11.5 - 15.5 % Final  . Platelets 11/30/2014 313  150 - 400 K/uL Final  . Preg, Serum 11/30/2014 NEGATIVE  NEGATIVE Final   Comment:        THE SENSITIVITY OF THIS METHODOLOGY IS >10 mIU/mL.    No results for input(s): HGB in the last 72 hours. No results for input(s): WBC, RBC, HCT, PLT in the last 72 hours. No results for input(s): NA, K, CL, CO2, BUN, CREATININE, GLUCOSE, CALCIUM in the last 72 hours. No results for input(s): LABPT, INR in the last 72 hours.  X-Rays:Dg Chest 2 View  11/30/2014   CLINICAL DATA:  Preop cardiovascular exam. Former smoker. History of asthma.  EXAM: CHEST  2 VIEW  COMPARISON:  12/16/2010  FINDINGS: The cardiomediastinal silhouette is within normal limits. The lungs are well inflated and clear. There is no evidence of pleural effusion or  pneumothorax. Suture anchor is noted in the right humeral head.  IMPRESSION: No active cardiopulmonary disease.   Electronically Signed   By: Logan Bores   On: 11/30/2014 14:27   Dg Lumbar Spine 2-3 Views  11/30/2014   CLINICAL DATA:  Preop evaluation for upcoming lumbar surgery  EXAM: LUMBAR SPINE - 2-3 VIEW  COMPARISON:  10/15/2014  FINDINGS: Vertebral body height is well maintained. Mild disc space narrowing is noted at L4-5 stable from the prior exam. The numbering nomenclature utilized to similar to that seen on prior MRI.  IMPRESSION: Mild degenerative change.   Electronically Signed   By: Inez Catalina M.D.   On: 11/30/2014 14:37   Dg Spine Portable 1 View  12/05/2014   CLINICAL DATA:  Intraoperative film for decompression of L4-5  EXAM: PORTABLE SPINE - 1 VIEW  COMPARISON:  Previous intraoperative image 2  FINDINGS: Image 3 cross-table lateral lumbar spine view from the operating room shows an instrument located at the L4-5 level for localization.  IMPRESSION: Localization of L4-5 interspace.   Electronically Signed   By: Ivar Drape M.D.   On: 12/05/2014 13:05   Dg Spine Portable 1 View  12/05/2014   CLINICAL DATA:  For lumbar spine surgery at L4-5  EXAM: PORTABLE SPINE - 1 VIEW  COMPARISON:  Lateral lumbar spine view from earlier today as well as lumbar spine films of 11/30/2014  FINDINGS: An instrument is positioned beneath the spinous process of L4 directed toward the superior posterior aspect of the L5 vertebral body.  IMPRESSION: Instrument positioned just below the spinous process of L4.   Electronically Signed   By: Ivar Drape M.D.   On: 12/05/2014 12:09   Dg Spine Portable 1 View  12/05/2014   CLINICAL DATA:  Lumbar spine surgery.  EXAM: PORTABLE SPINE - 1 VIEW  COMPARISON:  11/30/2014  FINDINGS: Single portable cross-table lateral intraoperative radiograph of the lumbar spine is provided. The tips of 2 needles project in the soft tissues of the lower back posterior and slightly  inferior to the L4 and L5 spinous processes.  IMPRESSION: Intraoperative image for localization during lumbar spine surgery.   Electronically Signed   By: Logan Bores   On: 12/05/2014 11:49    EKG: Orders placed or performed during the hospital encounter of 10/15/14  . EKG 12-Lead  . EKG 12-Lead  . EKG     Hospital Course: Patient was admitted to [facility] and taken to the OR and underwent the above state procedure without complications.  Patient tolerated the procedure well and was later transferred  to the recovery room and then to the orthopaedic floor for postoperative care.  They were given PO and IV analgesics for pain control following their surgery.  They were given 24 hours of postoperative antibiotics.   PT was consulted postop to assist with mobility and transfers.  The patient was allowed to be WBAT with therapy and was taught back precautions. Discharge planning was consulted to help with postop disposition and equipment needs.  Patient had a good night on the evening of surgery and started to get up OOB with therapy on day one. Patient was seen in rounds and was ready to go home on day one.  They were given discharge instructions and dressing directions.  They were instructed on when to follow up in the office with Dr. Tonita Cong.   Diet: Regular diet Activity:WBAT; Lspine precautions Follow-up:in 10-14 days Disposition - Home Discharged Condition: good   Discharge Instructions    Call MD / Call 911    Complete by:  As directed   If you experience chest pain or shortness of breath, CALL 911 and be transported to the hospital emergency room.  If you develope a fever above 101 F, pus (white drainage) or increased drainage or redness at the wound, or calf pain, call your surgeon's office.     Constipation Prevention    Complete by:  As directed   Drink plenty of fluids.  Prune juice may be helpful.  You may use a stool softener, such as Colace (over the counter) 100 mg twice  a day.  Use MiraLax (over the counter) for constipation as needed.     Diet - low sodium heart healthy    Complete by:  As directed      Increase activity slowly as tolerated    Complete by:  As directed             Medication List    STOP taking these medications        cyclobenzaprine 10 MG tablet  Commonly known as:  FLEXERIL     Fish Oil 1000 MG Cpdr     GARCINIA CAMBOGIA-CHROMIUM PO     HYDROcodone-acetaminophen 5-325 MG per tablet  Commonly known as:  NORCO/VICODIN      TAKE these medications        acetaminophen 650 MG CR tablet  Commonly known as:  TYLENOL  Take 650 mg by mouth every 8 (eight) hours as needed for pain.     albuterol 108 (90 BASE) MCG/ACT inhaler  Commonly known as:  PROVENTIL HFA;VENTOLIN HFA  Inhale 1-2 puffs into the lungs every 4 (four) hours as needed for wheezing or shortness of breath.     cholecalciferol 1000 UNITS tablet  Commonly known as:  VITAMIN D  Take 5,000 Units by mouth daily.     docusate sodium 100 MG capsule  Commonly known as:  COLACE  Take 1 capsule (100 mg total) by mouth 2 (two) times daily as needed for mild constipation.     meclizine 25 MG tablet  Commonly known as:  ANTIVERT  Take 1 tablet (25 mg total) by mouth 3 (three) times daily as needed (vertigo).     Melatonin 5 MG Tabs  Take 5 mg by mouth as needed.     meloxicam 15 MG tablet  Commonly known as:  MOBIC  Take 1 tablet (15 mg total) by mouth every morning. May resume 5 days post-op     methocarbamol 500 MG tablet  Commonly known as:  ROBAXIN  Take 1 tablet (500 mg total) by mouth 3 (three) times daily.     omeprazole 20 MG capsule  Commonly known as:  PRILOSEC  Take 20 mg by mouth daily at 12 noon.     oxyCODONE-acetaminophen 5-325 MG per tablet  Commonly known as:  PERCOCET  Take 1 tablet by mouth every 4 (four) hours as needed.     pravastatin 20 MG tablet  Commonly known as:  PRAVACHOL  Take 20 mg by mouth every evening.     PRO-BIOTIC  BLEND PO  Take 1 tablet by mouth every morning.     psyllium 0.52 G capsule  Commonly known as:  REGULOID  Take 1.04 g by mouth every other day.     VITAMIN B 12 PO  Take 1 each by mouth every morning. Sublingual.           Follow-up Information    Follow up with BEANE,JEFFREY C, MD In 2 weeks.   Specialty:  Orthopedic Surgery   Contact information:   [address] [place] [address] [phone]       Signed: Lacie Draft, PA-C for Dr. Tonita Cong Orthopaedic Surgery 12/06/2014, 9:27 AM

## 2014-12-06 NOTE — Care Management Note (Signed)
Case Management Note  Patient Details  Name: Jordan Kim MRN: [id number] Date of Birth: 1971-03-13  Subjective/Objective:                   REVISION MICRO LUMBAR DECOMPRESSION, MICRODISCECTOMY L4-L5 RIGHT  Action/Plan:  Discharge planning Expected Discharge Date:  12/06/14               Expected Discharge Plan:  Home/Self Care  In-House Referral:     Discharge planning Services  CM Consult  Post Acute Care Choice:    Choice offered to:     DME Arranged:    DME Agency:     HH Arranged:    HH Agency:     Status of Service:  Completed, signed off  Medicare Important Message Given:    Date Medicare IM Given:    Medicare IM give by:    Date Additional Medicare IM Given:    Additional Medicare Important Message give by:     If discussed at Long Length of Stay Meetings, dates discussed:    Additional Comments: CM notes no PT/OT recc or ordered.  No other CM needs were communicated. Yves Dill, RN 12/06/2014, 11:16 AM

## 2014-12-06 NOTE — Op Note (Signed)
NAMEARONDA, Jordan Kim               ACCOUNT NO.:  [id number]  MEDICAL RECORD NO.:  [id number]  LOCATION:  1601                         FACILITY:  [facility]  PHYSICIAN:  Jene Every, M.D.    DATE OF BIRTH:  05-01-1971  DATE OF PROCEDURE:  12/05/2014 DATE OF DISCHARGE:                              OPERATIVE REPORT   PREOPERATIVE DIAGNOSIS:  Recurrent disk herniation, spinal stenosis 4-5, right.  POSTOPERATIVE DIAGNOSIS:  Recurrent disk herniation, spinal stenosis 4- 5, right.  PROCEDURES PERFORMED: 1. Redo microdiskectomy L4-5, right. 2. Foraminotomies L4-L5, right. 3. Microdiskectomy 4-5, right.  ANESTHESIA:  General.  ASSISTANT:  Lanna Poche, P.A., who was required throughout the case for gentle intermittent neural traction, the patient's positioning, closure.  SPECIMEN:  L4-5 disk to Pathology.  HISTORY:  A 44 year old, history of lumbar decompression, did well recurrent disk herniation, refractory to rest, activity, modification, injection, pain in the L5 nerve root distribution, EHL weakness, positive neural tension signs, indicated for decompression, and foraminotomies as she has become stenotic.  Risks and benefits discussed including bleeding, infection, damage to neurovascular structure, DVT, PE, anesthetic complications, recurrent disk herniation, need for fusion in future etc.  TECHNIQUE:  With the patient in supine position, after induction of adequate general anesthesia, 2 g Kefzol, placed prone on the [place] frame.  All bony prominences well padded.  Lumbar region was prepped and draped in usual sterile fashion.  Two 18-gauge spinal needle was utilized to localize L4-5 interspace, confirmed with x-ray.  Incision was made from spinous process 4-5, subcutaneous tissue was dissected by cautery, was utilized to achieve hemostasis.  Dorsolumbar fascia identified and divided in-line with skin incision.  Paraspinous muscle elevated from lamina at 4-5.   Previous scar tissue was encountered.  We skeletonized the previous laminotomy with a straight curette.  Used a Insurance claims handler, confirmed the level with an x-ray.  Placed the operating microscope.  Used an osteotome and performing partial and hemi- facetectomy about 20% medial aspect of the facet, inferior process and superior process, we used 2 and 3 mm Kerrison.  I used a microcurette to develop a plane between the scar tissue and the thecal sac and the nerve root.  Nerve root was protected all times.  I then performed a generous foraminotomy of 5.  Identifying the root of 5, gently mobilizing it medially, freeing it from scar tissue and adhesions.  I performed foraminotomy of 4 as it was stenotic, foraminotomy of 5.  Focal disk herniation of 4-5 was noted.  I performed an annulotomy and copious portion of disk material was removed from disk space with straight upbiting pituitary further mobilized with an Epstein.  Multiple fragments were removed, this decompressed the L5 nerve root.  Irrigated the disk space with antibiotic irrigation.  A 1 cm of excursion of the 5 root was noted medial to the pedicle.  Neural probe passed freely at the foramen of 4 and 5.  Confirmatory radiograph obtained with a marker at the 4-5 disk space.  Bipolar electrocautery was utilized to achieve hemostasis.  Inspection revealed no evidence of CSF leakage or active bleeding.  We checked beneath thecal sac, the axilla and the shoulder of the root of  5 down the foramen of 4 and 5.  No residual disk herniation. Neural compression.  Therefore, removed the retractor.  After thrombin- soaked Gelfoam was placed in laminotomy defect, repaired with dorsolumbar fascia with 1 Vicryl, subcu with 2-0, and skin with Prolene. Sterile dressing applied.  Placed supine on the hospital bed, extubated without difficulty, and transported to the recovery room in satisfactory condition.  The patient tolerated the procedure  well.  No complications.  Minimal blood loss.     Jene Every, M.D.     [name]  D:  12/05/2014  T:  12/06/2014  Job:  [id number]

## 2014-12-06 NOTE — Progress Notes (Signed)
Subjective: 1 Day Post-Op Procedure(s) (LRB): REVISION MICRO LUMBAR DECOMPRESSION, MICRODISCECTOMY L4-L5 RIGHT (Right) Patient reports pain as mild.  R leg pain less severe. Foley removed early this AM has voided since without issues. Feels ready to go home today. No other c/o.  Objective: Vital signs in last 24 hours: Temp:  [97.3 F (36.3 C)-98.6 F (37 C)] 97.6 F (36.4 C) (06/16 0534) Pulse Rate:  [60-90] 69 (06/16 0534) Resp:  [12-17] 16 (06/16 0534) BP: (109-128)/(52-75) 109/61 mmHg (06/16 0534) SpO2:  [96 %-100 %] 100 % (06/16 0534) Weight:  [108.41 kg (239 lb)] 108.41 kg (239 lb) (06/15 0944)  Intake/Output from previous day: 06/15 0701 - 06/16 0700 In: 3451.7 [P.O.:1440; I.V.:1856.7; IV Piggyback:155] Out: 4425 [Urine:4375; Blood:50] Intake/Output this shift:    No results for input(s): HGB in the last 72 hours. No results for input(s): WBC, RBC, HCT, PLT in the last 72 hours. No results for input(s): NA, K, CL, CO2, BUN, CREATININE, GLUCOSE, CALCIUM in the last 72 hours. No results for input(s): LABPT, INR in the last 72 hours.  Neurologically intact ABD soft Neurovascular intact Sensation intact distally Intact pulses distally Dorsiflexion/Plantar flexion intact Incision: dressing C/D/I and no drainage No cellulitis present Compartment soft no sign of DVT  Assessment/Plan: 1 Day Post-Op Procedure(s) (LRB): REVISION MICRO LUMBAR DECOMPRESSION, MICRODISCECTOMY L4-L5 RIGHT (Right) Advance diet Up with therapy D/C IV fluids  Discussed D/C instructions, Lspine precautions, dressing instructions D/C home today after PT  Jordan Kim M. 12/06/2014, 9:24 AM

## 2014-12-06 NOTE — Evaluation (Signed)
Occupational Therapy Evaluation Patient Details Name: Jordan Kim MRN: [id number] DOB: 1970-08-29 Today's Date: 12/06/2014    History of Present Illness Pt is s/p REVISION MICRO LUMBAR DECOMPRESSION, MICRODISCECTOMY L4-L5 RIGHT    Clinical Impression   Pt overall at min assist to supervision level with ADL. She has initial assist at d/c as needed. Educated on AE options for LB self care versus crossing LEs up which pt can do with some effort. She will benefit from toilet aid option as she cant perform without breaking precautions. Educated on where to obtain all AE. All education completed for d/c today.    Follow Up Recommendations  No OT follow up;Supervision - Intermittent    Equipment Recommendations  None recommended by OT    Recommendations for Other Services       Precautions / Restrictions Precautions Precautions: Back Precaution Booklet Issued: Yes (comment) Precaution Comments: Issued back care handout and reviewed all precautions. Pt able to state 2/3 Restrictions Weight Bearing Restrictions: No      Mobility Bed Mobility Overal bed mobility: Needs Assistance Bed Mobility: Rolling;Sidelying to Sit Rolling: Supervision Sidelying to sit: Supervision       General bed mobility comments: cues for back precautions.   Transfers Overall transfer level: Needs assistance Equipment used: None Transfers: Sit to/from Stand Sit to Stand: Supervision         General transfer comment: cues for back precautions.    Balance                                           ADL Overall ADL's : Needs assistance/impaired Eating/Feeding: Independent;Sitting   Grooming: Wash/dry hands;Supervision/safety;Standing   Upper Body Bathing: Set up;Sitting;Supervision/ safety   Lower Body Bathing: Minimal assistance;Sit to/from stand   Upper Body Dressing : Supervision/safety;Standing   Lower Body Dressing: Minimal assistance;With adaptive equipment;Sit  to/from stand   Toilet Transfer: Supervision/safety;Ambulation;Comfort height toilet;Grab bars   Toileting- Clothing Manipulation and Hygiene: Supervision/safety;Sit to/from stand   Tub/ Shower Transfer: Tub transfer;Min guard     General ADL Comments: Educated on all back precautions and reviewed information on handout. Pt initially not able to cross R LE up to L in order to don clothing but with increased practice, pt able to cross LEs up better. She did use the reacher to don pants over R LE since she was having some difficulty crossing LE up when she was donning clothing. Educated on all AE options including toilet aid. Feel pt could have assist with LB dressing versus use of AE as needed if she has difficulty with the crossing LEs up. Family did purchase AE kit from gift shop and were asking after session about toilet aid which OT explaind is purchased separate from kit. pt not able to perform toilet hygiene without breaking precautions so she would benefit from AE. Pt needs intermittent cues for back precautions during ADL. BP taken in sitting and standing and informed nursing. pt with history of vertigo and syncope.      Vision     Perception     Praxis      Pertinent Vitals/Pain Pain Assessment: 0-10 Pain Score: 7  Pain Location: incision and R buttock Pain Descriptors / Indicators: Sharp Pain Intervention(s): Limited activity within patient's tolerance;Monitored during session;Premedicated before session     Hand Dominance     Extremity/Trunk Assessment Upper Extremity Assessment Upper Extremity Assessment: Overall  WFL for tasks assessed      Cervical / Trunk Assessment Cervical / Trunk Assessment: Normal   Communication Communication Communication: No difficulties   Cognition Arousal/Alertness: Awake/alert Behavior During Therapy: WFL for tasks assessed/performed Overall Cognitive Status: Within Functional Limits for tasks assessed                      General Comments       Exercises       Shoulder Instructions      Home Living Family/patient expects to be discharged to:: Private residence Living Arrangements: Spouse/significant other Available Help at Discharge: Family Type of Home: House Home Access: Ramped entrance     [place]: Able to live on main level with bedroom/bathroom     Bathroom Shower/Tub: Teacher, early years/pre:  (comfort height)     Home Equipment: Cane - single point;Grab bars - tub/shower;Grab bars - toilet          Prior Functioning/Environment Level of Independence: Independent        Comments: used cane    OT Diagnosis: Generalized weakness   OT Problem List:     OT Treatment/Interventions:      OT Goals(Current goals can be found in the care plan section) Acute Rehab OT Goals Patient Stated Goal: decrease pain and increase independence. OT Goal Formulation: With patient  OT Frequency:     Barriers to D/C:            Co-evaluation              End of Session    Activity Tolerance: Patient tolerated treatment well Patient left: in chair;with call bell/phone within reach   Time: 2633-3545 OT Time Calculation (min): 42 min Charges:  OT General Charges $OT Visit: 1 Procedure OT Treatments $Self Care/Home Management : 8-22 mins $Therapeutic Activity: 8-22 mins G-Codes: OT G-codes **NOT FOR INPATIENT CLASS** Functional Assessment Tool Used: clinical judgement Functional Limitation: Self care Self Care Current Status (G2563): At least 1 percent but less than 20 percent impaired, limited or restricted Self Care Goal Status (S9373): At least 1 percent but less than 20 percent impaired, limited or restricted Self Care Discharge Status [phone]): At least 1 percent but less than 20 percent impaired, limited or restricted  Jules Schick  [phone] 12/06/2014, 10:36 AM

## 2014-12-06 NOTE — Evaluation (Signed)
Physical Therapy Evaluation Patient Details Name: Jordan Kim MRN: [id number] DOB: Nov 12, 1970 Today's Date: 12/06/2014   History of Present Illness  Pt is s/p REVISION MICRO LUMBAR DECOMPRESSION, MICRODISCECTOMY L4-L5 RIGHT   Clinical Impression  Pt ambulated 160' with a cane without loss of balance. From PT standpoint she is ready to DC home. Encouraged frequent ambulation at home and reviewed back precautions.     Follow Up Recommendations No PT follow up    Equipment Recommendations  None recommended by PT    Recommendations for Other Services OT consult     Precautions / Restrictions Precautions Precautions: Back Precaution Booklet Issued: Yes (comment) Precaution Comments: Issued back care handout and reviewed all precautions. Pt able to state 2/3 Restrictions Weight Bearing Restrictions: No      Mobility  Bed Mobility               General bed mobility comments: NT-up in chair  Transfers Overall transfer level: Needs assistance Equipment used: Straight cane Transfers: Sit to/from Stand Sit to Stand: Supervision         General transfer comment: supervision for safety  Ambulation/Gait   Ambulation Distance (Feet): 160 Feet Assistive device: Straight cane Gait Pattern/deviations: Step-through pattern;Decreased stride length   Gait velocity interpretation: Below normal speed for age/gender General Gait Details: steady with SPC, increased time  Stairs            Wheelchair Mobility    Modified Rankin (Stroke Patients Only)       Balance Overall balance assessment: Modified Independent                                           Pertinent Vitals/Pain Pain Assessment: 0-10 Pain Score: 7  Pain Location: incision and R buttock Pain Descriptors / Indicators: Sharp Pain Intervention(s): Limited activity within patient's tolerance;Monitored during session;Premedicated before session    Home Living Family/patient  expects to be discharged to:: Private residence Living Arrangements: Spouse/significant other Available Help at Discharge: Family Type of Home: House Home Access: Ramped entrance     Home Layout: Able to live on main level with bedroom/bathroom Home Equipment: Cane - single point;Grab bars - tub/shower;Grab bars - toilet      Prior Function Level of Independence: Independent         Comments: used cane     Hand Dominance        Extremity/Trunk Assessment   Upper Extremity Assessment: Overall WFL for tasks assessed           Lower Extremity Assessment: RLE deficits/detail RLE Deficits / Details: knee extension -4/5, ankle DF -4/5, decr sensation to light touch lateral foot (this was pre-existing)    Cervical / Trunk Assessment: Normal  Communication   Communication: No difficulties  Cognition Arousal/Alertness: Awake/alert Behavior During Therapy: WFL for tasks assessed/performed Overall Cognitive Status: Within Functional Limits for tasks assessed                      General Comments      Exercises        Assessment/Plan    PT Assessment Patent does not need any further PT services  PT Diagnosis Acute pain   PT Problem List    PT Treatment Interventions     PT Goals (Current goals can be found in the Care Plan section) Acute Rehab PT Goals Patient  Stated Goal: to be able to work, pt expressed discouragement over not being able to find work she can do PT Goal Formulation: All assessment and education complete, DC therapy    Frequency     Barriers to discharge        Co-evaluation               End of Session Equipment Utilized During Treatment: Gait belt Activity Tolerance: Patient limited by pain;Patient limited by fatigue Patient left: in bed;with call bell/phone within reach;with family/visitor present Nurse Communication: Mobility status    Functional Assessment Tool Used: clinical judgement Functional Limitation:  Mobility: Walking and moving around Mobility: Walking and Moving Around Current Status (Z6109): At least 1 percent but less than 20 percent impaired, limited or restricted Mobility: Walking and Moving Around Goal Status [phone]): At least 1 percent but less than 20 percent impaired, limited or restricted Mobility: Walking and Moving Around Discharge Status [phone]): At least 1 percent but less than 20 percent impaired, limited or restricted    Time: 1003-1026 PT Time Calculation (min) (ACUTE ONLY): 23 min   Charges:   PT Evaluation $Initial PT Evaluation Tier I: 1 Procedure PT Treatments $Gait Training: 8-22 mins   PT G Codes:   PT G-Codes **NOT FOR INPATIENT CLASS** Functional Assessment Tool Used: clinical judgement Functional Limitation: Mobility: Walking and moving around Mobility: Walking and Moving Around Current Status (B1478): At least 1 percent but less than 20 percent impaired, limited or restricted Mobility: Walking and Moving Around Goal Status [phone]): At least 1 percent but less than 20 percent impaired, limited or restricted Mobility: Walking and Moving Around Discharge Status [phone]): At least 1 percent but less than 20 percent impaired, limited or restricted    Tamala Ser 12/06/2014, 10:35 AM [phone]

## 2014-12-09 LAB — TYPE AND SCREEN
ABO/RH(D): O POS
Antibody Screen: POSITIVE
DAT, IgG: NEGATIVE
DONOR AG TYPE: NEGATIVE
PT AG Type: NEGATIVE
Unit division: 0

## 2014-12-19 ENCOUNTER — Ambulatory Visit (INDEPENDENT_AMBULATORY_CARE_PROVIDER_SITE_OTHER): Payer: Commercial Managed Care - PPO | Admitting: Podiatry

## 2014-12-19 DIAGNOSIS — M722 Plantar fascial fibromatosis: Secondary | ICD-10-CM

## 2014-12-20 NOTE — Progress Notes (Signed)
Subjective:     Patient ID: Jordan Kim, female   DOB: 04/19/1971, 44 y.o.   MRN: [id number]  HPIThis patient presents to pick up her inserts.   Review of Systems     Objective:   Physical Exam Objective: Review of past medical history, medications, social history and allergies were performed.  Vascular: Dorsalis pedis and posterior tibial pulses were palpable B/L, capillary refill was  WNL B/L, temperature gradient was WNL B/L   Skin:  No signs of symptoms of infection or ulcers on both feet  Nails: appear healthy with no signs of mycosis or infections  Sensory: Semmes Weinstein monifilament WNL   Orthopedic: Orthopedic evaluation demonstrates all joints distal t ankle have full ROM without crepitus, muscle power WNL B/L    Assessment:     Plantar Fascitis     Plan:     Dsp inserts.  RTC prn.

## 2015-04-04 NOTE — Telephone Encounter (Signed)
This encounter was created in error - please disregard.

## 2016-08-13 DIAGNOSIS — M25562 Pain in left knee: Secondary | ICD-10-CM | POA: Insufficient documentation

## 2016-11-19 DIAGNOSIS — K219 Gastro-esophageal reflux disease without esophagitis: Secondary | ICD-10-CM | POA: Insufficient documentation

## 2016-11-19 DIAGNOSIS — E782 Mixed hyperlipidemia: Secondary | ICD-10-CM | POA: Insufficient documentation

## 2016-11-19 DIAGNOSIS — G8929 Other chronic pain: Secondary | ICD-10-CM | POA: Insufficient documentation

## 2016-11-19 DIAGNOSIS — F32A Depression, unspecified: Secondary | ICD-10-CM | POA: Insufficient documentation

## 2016-11-19 DIAGNOSIS — I10 Essential (primary) hypertension: Secondary | ICD-10-CM | POA: Insufficient documentation

## 2016-12-29 DIAGNOSIS — Z87442 Personal history of urinary calculi: Secondary | ICD-10-CM | POA: Insufficient documentation

## 2016-12-29 DIAGNOSIS — E8881 Metabolic syndrome: Secondary | ICD-10-CM | POA: Insufficient documentation

## 2016-12-29 DIAGNOSIS — K2100 Gastro-esophageal reflux disease with esophagitis, without bleeding: Secondary | ICD-10-CM | POA: Insufficient documentation

## 2016-12-29 DIAGNOSIS — Z6836 Body mass index (BMI) 36.0-36.9, adult: Secondary | ICD-10-CM | POA: Insufficient documentation

## 2016-12-29 DIAGNOSIS — E559 Vitamin D deficiency, unspecified: Secondary | ICD-10-CM | POA: Insufficient documentation

## 2016-12-29 DIAGNOSIS — R252 Cramp and spasm: Secondary | ICD-10-CM | POA: Insufficient documentation

## 2016-12-29 DIAGNOSIS — R300 Dysuria: Secondary | ICD-10-CM | POA: Insufficient documentation

## 2017-04-22 IMAGING — DX DG SPINE 1V PORT
1 series · 1 of 1 positions shown · non-contrast
Comparison: Previous intraoperative image 2

CLINICAL DATA: Intraoperative film for decompression of L4-5

EXAM:
PORTABLE SPINE - 1 VIEW

[l-spine x-table]
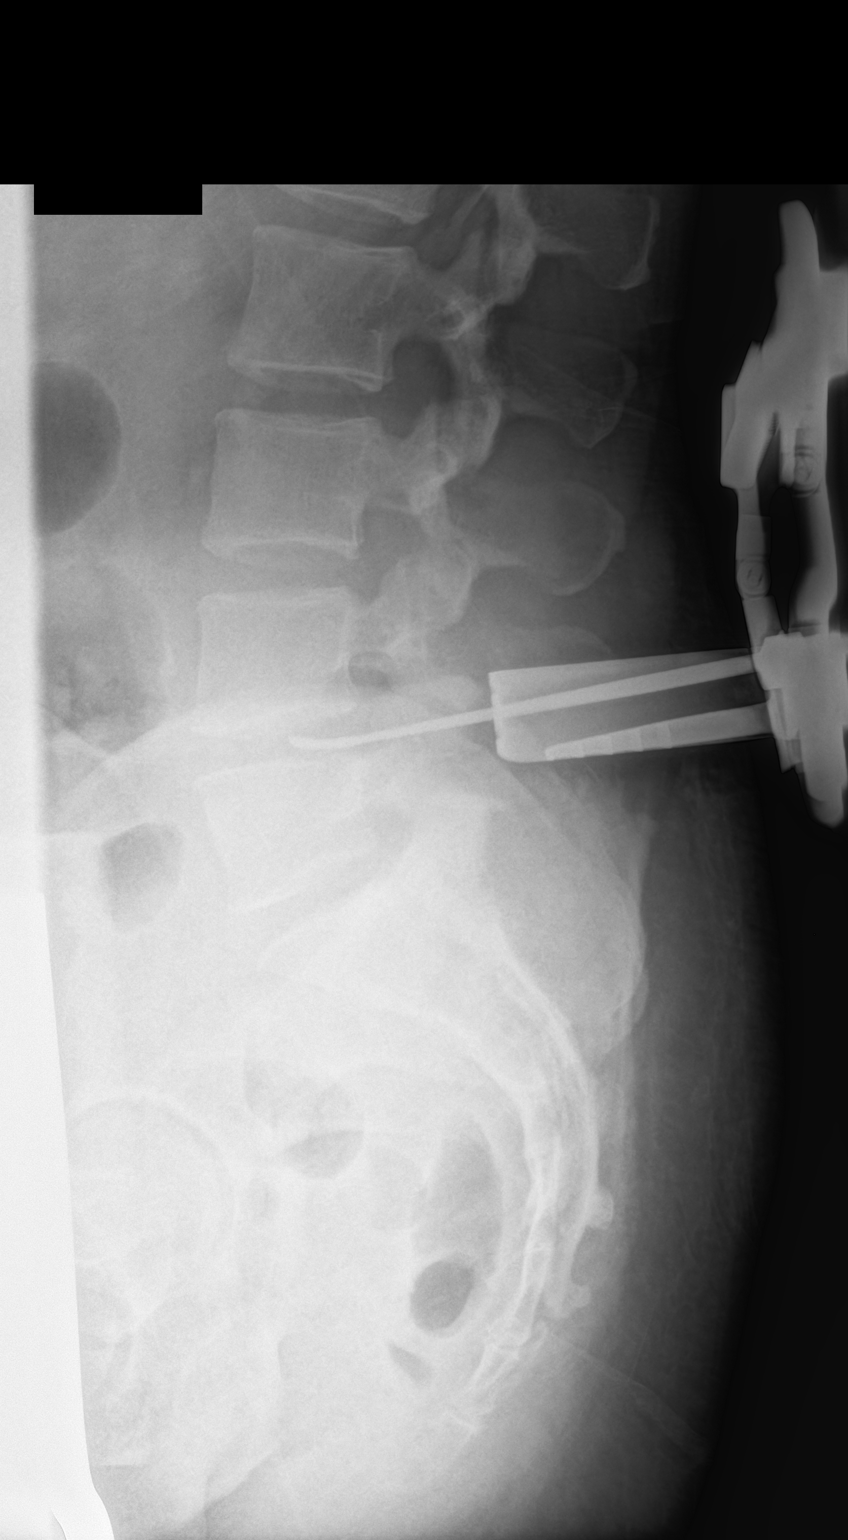

[1 of 1 positions shown; findings below may reference images not displayed]

FINDINGS: Image 3 cross-table lateral lumbar spine view from the operating
room shows an instrument located at the L4-5 level for localization.
IMPRESSION: Localization of L4-5 interspace.

## 2017-07-14 DIAGNOSIS — L03031 Cellulitis of right toe: Secondary | ICD-10-CM | POA: Diagnosis not present

## 2017-07-14 DIAGNOSIS — R351 Nocturia: Secondary | ICD-10-CM | POA: Diagnosis not present

## 2017-07-14 DIAGNOSIS — N760 Acute vaginitis: Secondary | ICD-10-CM | POA: Diagnosis not present

## 2017-07-14 DIAGNOSIS — L600 Ingrowing nail: Secondary | ICD-10-CM | POA: Diagnosis not present

## 2017-07-14 DIAGNOSIS — B9689 Other specified bacterial agents as the cause of diseases classified elsewhere: Secondary | ICD-10-CM | POA: Diagnosis not present

## 2017-07-28 DIAGNOSIS — M5442 Lumbago with sciatica, left side: Secondary | ICD-10-CM | POA: Diagnosis not present

## 2017-07-28 DIAGNOSIS — E669 Obesity, unspecified: Secondary | ICD-10-CM | POA: Diagnosis not present

## 2017-07-28 DIAGNOSIS — Z6834 Body mass index (BMI) 34.0-34.9, adult: Secondary | ICD-10-CM | POA: Diagnosis not present

## 2017-07-28 DIAGNOSIS — E7800 Pure hypercholesterolemia, unspecified: Secondary | ICD-10-CM | POA: Diagnosis not present

## 2017-07-28 DIAGNOSIS — R5383 Other fatigue: Secondary | ICD-10-CM | POA: Diagnosis not present

## 2017-07-28 DIAGNOSIS — Z0000 Encounter for general adult medical examination without abnormal findings: Secondary | ICD-10-CM | POA: Diagnosis not present

## 2017-07-28 DIAGNOSIS — G8929 Other chronic pain: Secondary | ICD-10-CM | POA: Diagnosis not present

## 2017-07-28 DIAGNOSIS — M5441 Lumbago with sciatica, right side: Secondary | ICD-10-CM | POA: Diagnosis not present

## 2017-07-28 DIAGNOSIS — I10 Essential (primary) hypertension: Secondary | ICD-10-CM | POA: Diagnosis not present

## 2017-07-28 DIAGNOSIS — R5381 Other malaise: Secondary | ICD-10-CM | POA: Diagnosis not present

## 2017-07-28 DIAGNOSIS — E782 Mixed hyperlipidemia: Secondary | ICD-10-CM | POA: Diagnosis not present

## 2017-07-30 DIAGNOSIS — D7589 Other specified diseases of blood and blood-forming organs: Secondary | ICD-10-CM | POA: Diagnosis not present

## 2017-07-30 DIAGNOSIS — Z79899 Other long term (current) drug therapy: Secondary | ICD-10-CM | POA: Diagnosis not present

## 2017-07-30 DIAGNOSIS — Z87440 Personal history of urinary (tract) infections: Secondary | ICD-10-CM | POA: Diagnosis not present

## 2017-07-30 DIAGNOSIS — R937 Abnormal findings on diagnostic imaging of other parts of musculoskeletal system: Secondary | ICD-10-CM | POA: Diagnosis not present

## 2017-08-13 ENCOUNTER — Encounter: Payer: Self-pay | Admitting: Sports Medicine

## 2017-08-13 ENCOUNTER — Ambulatory Visit (INDEPENDENT_AMBULATORY_CARE_PROVIDER_SITE_OTHER): Payer: Commercial Managed Care - PPO | Admitting: Sports Medicine

## 2017-08-13 DIAGNOSIS — M2141 Flat foot [pes planus] (acquired), right foot: Secondary | ICD-10-CM

## 2017-08-13 DIAGNOSIS — M2142 Flat foot [pes planus] (acquired), left foot: Secondary | ICD-10-CM | POA: Diagnosis not present

## 2017-08-13 DIAGNOSIS — M05772 Rheumatoid arthritis with rheumatoid factor of left ankle and foot without organ or systems involvement: Secondary | ICD-10-CM

## 2017-08-13 DIAGNOSIS — M722 Plantar fascial fibromatosis: Secondary | ICD-10-CM | POA: Diagnosis not present

## 2017-08-13 DIAGNOSIS — L84 Corns and callosities: Secondary | ICD-10-CM

## 2017-08-13 DIAGNOSIS — M05771 Rheumatoid arthritis with rheumatoid factor of right ankle and foot without organ or systems involvement: Secondary | ICD-10-CM

## 2017-08-13 DIAGNOSIS — M79671 Pain in right foot: Secondary | ICD-10-CM

## 2017-08-13 DIAGNOSIS — L930 Discoid lupus erythematosus: Secondary | ICD-10-CM

## 2017-08-13 DIAGNOSIS — M79672 Pain in left foot: Secondary | ICD-10-CM | POA: Diagnosis not present

## 2017-08-13 NOTE — Progress Notes (Signed)
Subjective: Jordan Kim is a 47 y.o. female patient presents to office with complaint of callus to heel and foot pain. Patient admits to history of back pain and arthritis and states that she is unable to go without orthotics.  Patient states that she tried this heel callus treatment that took off a lot of the skin on the bottom of both feet.  Patient states that after she did this her nails became very soft and she had issues with ingrown toenail in her right first toenail coming off in which her medical doctor trimmed for her and treated.  Patient also states that she needs new orthotics and orthotics are very helpful for her foot pain she cannot go without them because of her severe neuropathy from her back and her history of rheumatoid arthritis and lupus.  Patient states for the callus is currently she has been using a rash or pumice stone using heel cream and covers to her heels to help keep the callus down.  Patient states that she believes this callus comes from her orthotics rubbing.  Denies any other pedal complaints.   Patient Active Problem List   Diagnosis Date Noted  . HNP (herniated nucleus pulposus), lumbar 12/05/2014  . Spinal stenosis at L4-L5 level 12/05/2014  . Syncope and collapse   . Syncope 10/15/2014    Current Outpatient Medications on File Prior to Visit  Medication Sig Dispense Refill  . acetaminophen (TYLENOL) 650 MG CR tablet Take 650 mg by mouth every 8 (eight) hours as needed for pain.    Marland Kitchen. albuterol (PROVENTIL HFA;VENTOLIN HFA) 108 (90 BASE) MCG/ACT inhaler Inhale 1-2 puffs into the lungs every 4 (four) hours as needed for wheezing or shortness of breath.    Marland Kitchen. aspirin EC 81 MG tablet Take by mouth.    . cholecalciferol (VITAMIN D) 1000 UNITS tablet Take 5,000 Units by mouth daily.    . Cyanocobalamin (VITAMIN B 12 PO) Take 1 each by mouth every morning. Sublingual.    . cyclobenzaprine (FLEXERIL) 10 MG tablet Take 10 mg by mouth every 8 (eight) hours as needed.   3  . desogestrel-ethinyl estradiol (KARIVA,AZURETTE,MIRCETTE) 0.15-0.02/0.01 MG (21/5) tablet Take 1 tablet by mouth daily.  11  . diazepam (VALIUM) 5 MG tablet Take 1 tablet (5 mg total) by mouth nightly as needed for Anxiety.  2  . docusate sodium (COLACE) 100 MG capsule Take 1 capsule (100 mg total) by mouth 2 (two) times daily as needed for mild constipation. 20 capsule 1  . meclizine (ANTIVERT) 25 MG tablet Take 1 tablet (25 mg total) by mouth 3 (three) times daily as needed (vertigo). 30 tablet 0  . meloxicam (MOBIC) 15 MG tablet Take 1 tablet (15 mg total) by mouth every morning. May resume 5 days post-op    . methocarbamol (ROBAXIN) 500 MG tablet Take 1 tablet (500 mg total) by mouth 3 (three) times daily. 40 tablet 1  . omeprazole (PRILOSEC) 20 MG capsule Take 20 mg by mouth daily at 12 noon.    Marland Kitchen. oxyCODONE-acetaminophen (PERCOCET) 5-325 MG per tablet Take 1 tablet by mouth every 4 (four) hours as needed. 60 tablet 0  . pravastatin (PRAVACHOL) 20 MG tablet Take 20 mg by mouth every evening.    . Probiotic Product (PRO-BIOTIC BLEND PO) Take 1 tablet by mouth every morning.     . traZODone (DESYREL) 50 MG tablet TAKE 1/2 TO 1 TABLET BY MOUTH NIGHTLY AS NEEDED FOR SLEEP  0  . Turmeric 400  MG CAPS Take by mouth.    . Melatonin 5 MG TABS Take 5 mg by mouth as needed.    . psyllium (REGULOID) 0.52 G capsule Take 1.04 g by mouth every other day.     No current facility-administered medications on file prior to visit.     Allergies  Allergen Reactions  . Morphine And Related Anaphylaxis and Rash    Tolerates hydromorphone.  . Demerol [Meperidine]     Cant remember  . Pregabalin Swelling  . Latex Rash    Objective: Physical Exam General: The patient is alert and oriented x3 in no acute distress.  Dermatology: Skin is warm, dry and supple bilateral lower extremities. Nails 1-10 are within normal limits there is mild dry skin at heels and around the medial nail folds bilateral however  there is no acute ingrowing.  There is no erythema, edema, no eccymosis, no open lesions present. Integument is otherwise unremarkable.  Vascular: Dorsalis Pedis pulse and Posterior Tibial pulse are 2/4 bilateral. Capillary fill time is immediate to all digits.  Neurological: Grossly intact to light touch with an achilles reflex of +2/5 and a  negative Tinel's sign bilateral.  Protective sensation severely diminished right greater than left contrary to history of radiculopathy.  Musculoskeletal: Tenderness to palpation at the medial calcaneal tubercale and through the insertion of the plantar fascia on the right greater than left foot. No pain with compression of calcaneus bilateral. No pain with tuning fork to calcaneus bilateral. No pain with calf compression bilateral. There is decreased Ankle joint range of motion limited bilateral. All other joints range of motion within normal limits bilateral.  Positive pes planus deformity.  Strength 5/5 in all groups bilateral.   Assessment and Plan: Problem List Items Addressed This Visit    None    Visit Diagnoses    Plantar fasciitis, bilateral    -  Primary   Pes planus of both feet       Rheumatoid arthritis involving both feet with positive rheumatoid factor (HCC)       Relevant Medications   aspirin EC 81 MG tablet   cyclobenzaprine (FLEXERIL) 10 MG tablet   Lupus erythematosus, unspecified form       Foot pain, bilateral       Callus of heel          -Complete examination performed.  -Discussed with patient in detail the condition of plantar fasciitis pes planus radiculopathy neuropathy and callus -Prescribed again custom molded foot orthotics since patient has done well with that in the past for foot pain.  Patient was casted and molded today orthotic prescription sent to [facility] lab -Recommend daily stretching for history of fasciitis tight calf muscles and history of spasming -Advised patient to continue with softening creams at  nail fold and it heals to prevent excessive callusing and dry skin -Patient to return to office pick up orthotics or sooner if problems or questions arise.  Asencion Islam, DPM

## 2017-08-19 DIAGNOSIS — R768 Other specified abnormal immunological findings in serum: Secondary | ICD-10-CM | POA: Diagnosis not present

## 2017-08-19 DIAGNOSIS — M2550 Pain in unspecified joint: Secondary | ICD-10-CM | POA: Diagnosis not present

## 2017-08-25 DIAGNOSIS — D7589 Other specified diseases of blood and blood-forming organs: Secondary | ICD-10-CM | POA: Diagnosis not present

## 2017-08-25 DIAGNOSIS — R937 Abnormal findings on diagnostic imaging of other parts of musculoskeletal system: Secondary | ICD-10-CM | POA: Diagnosis not present

## 2017-08-31 DIAGNOSIS — G8929 Other chronic pain: Secondary | ICD-10-CM | POA: Diagnosis not present

## 2017-08-31 DIAGNOSIS — M7551 Bursitis of right shoulder: Secondary | ICD-10-CM | POA: Diagnosis not present

## 2017-08-31 DIAGNOSIS — Z9889 Other specified postprocedural states: Secondary | ICD-10-CM | POA: Diagnosis not present

## 2017-09-01 DIAGNOSIS — G8929 Other chronic pain: Secondary | ICD-10-CM | POA: Diagnosis not present

## 2017-09-01 DIAGNOSIS — M7551 Bursitis of right shoulder: Secondary | ICD-10-CM | POA: Diagnosis not present

## 2017-09-06 DIAGNOSIS — Z13820 Encounter for screening for osteoporosis: Secondary | ICD-10-CM | POA: Diagnosis not present

## 2017-09-06 DIAGNOSIS — M8589 Other specified disorders of bone density and structure, multiple sites: Secondary | ICD-10-CM | POA: Diagnosis not present

## 2017-09-12 DIAGNOSIS — B029 Zoster without complications: Secondary | ICD-10-CM | POA: Diagnosis not present

## 2017-09-12 DIAGNOSIS — R21 Rash and other nonspecific skin eruption: Secondary | ICD-10-CM | POA: Diagnosis not present

## 2017-09-24 DIAGNOSIS — L249 Irritant contact dermatitis, unspecified cause: Secondary | ICD-10-CM | POA: Diagnosis not present

## 2018-01-13 DIAGNOSIS — E669 Obesity, unspecified: Secondary | ICD-10-CM | POA: Diagnosis not present

## 2018-01-13 DIAGNOSIS — G8929 Other chronic pain: Secondary | ICD-10-CM | POA: Diagnosis not present

## 2018-01-13 DIAGNOSIS — I10 Essential (primary) hypertension: Secondary | ICD-10-CM | POA: Diagnosis not present

## 2018-01-13 DIAGNOSIS — R42 Dizziness and giddiness: Secondary | ICD-10-CM | POA: Diagnosis not present

## 2018-01-13 DIAGNOSIS — M5441 Lumbago with sciatica, right side: Secondary | ICD-10-CM | POA: Diagnosis not present

## 2018-01-13 DIAGNOSIS — F419 Anxiety disorder, unspecified: Secondary | ICD-10-CM | POA: Diagnosis not present

## 2018-01-13 DIAGNOSIS — M2550 Pain in unspecified joint: Secondary | ICD-10-CM | POA: Diagnosis not present

## 2018-01-13 DIAGNOSIS — G2581 Restless legs syndrome: Secondary | ICD-10-CM | POA: Diagnosis not present

## 2018-01-13 DIAGNOSIS — M5442 Lumbago with sciatica, left side: Secondary | ICD-10-CM | POA: Diagnosis not present

## 2018-01-14 DIAGNOSIS — F419 Anxiety disorder, unspecified: Secondary | ICD-10-CM | POA: Insufficient documentation

## 2018-01-14 DIAGNOSIS — G2581 Restless legs syndrome: Secondary | ICD-10-CM | POA: Insufficient documentation

## 2018-01-14 DIAGNOSIS — M2550 Pain in unspecified joint: Secondary | ICD-10-CM | POA: Insufficient documentation

## 2018-02-16 DIAGNOSIS — E669 Obesity, unspecified: Secondary | ICD-10-CM | POA: Diagnosis not present

## 2018-02-16 DIAGNOSIS — M2550 Pain in unspecified joint: Secondary | ICD-10-CM | POA: Diagnosis not present

## 2018-02-16 DIAGNOSIS — Z6832 Body mass index (BMI) 32.0-32.9, adult: Secondary | ICD-10-CM | POA: Diagnosis not present

## 2018-02-16 DIAGNOSIS — M7989 Other specified soft tissue disorders: Secondary | ICD-10-CM | POA: Diagnosis not present

## 2018-03-15 DIAGNOSIS — E669 Obesity, unspecified: Secondary | ICD-10-CM | POA: Diagnosis not present

## 2018-03-15 DIAGNOSIS — M2550 Pain in unspecified joint: Secondary | ICD-10-CM | POA: Diagnosis not present

## 2018-03-15 DIAGNOSIS — Z6832 Body mass index (BMI) 32.0-32.9, adult: Secondary | ICD-10-CM | POA: Diagnosis not present

## 2018-03-15 DIAGNOSIS — M0579 Rheumatoid arthritis with rheumatoid factor of multiple sites without organ or systems involvement: Secondary | ICD-10-CM | POA: Diagnosis not present

## 2018-03-15 DIAGNOSIS — M7989 Other specified soft tissue disorders: Secondary | ICD-10-CM | POA: Diagnosis not present

## 2018-03-22 DIAGNOSIS — H25812 Combined forms of age-related cataract, left eye: Secondary | ICD-10-CM | POA: Diagnosis not present

## 2018-03-22 DIAGNOSIS — Z01818 Encounter for other preprocedural examination: Secondary | ICD-10-CM | POA: Diagnosis not present

## 2018-03-28 DIAGNOSIS — H25812 Combined forms of age-related cataract, left eye: Secondary | ICD-10-CM | POA: Diagnosis not present

## 2018-04-26 DIAGNOSIS — Z6833 Body mass index (BMI) 33.0-33.9, adult: Secondary | ICD-10-CM | POA: Diagnosis not present

## 2018-04-26 DIAGNOSIS — M2550 Pain in unspecified joint: Secondary | ICD-10-CM | POA: Diagnosis not present

## 2018-04-26 DIAGNOSIS — E669 Obesity, unspecified: Secondary | ICD-10-CM | POA: Diagnosis not present

## 2018-04-26 DIAGNOSIS — M0579 Rheumatoid arthritis with rheumatoid factor of multiple sites without organ or systems involvement: Secondary | ICD-10-CM | POA: Diagnosis not present

## 2018-04-26 DIAGNOSIS — Z79899 Other long term (current) drug therapy: Secondary | ICD-10-CM | POA: Diagnosis not present

## 2018-05-02 DIAGNOSIS — Z23 Encounter for immunization: Secondary | ICD-10-CM | POA: Diagnosis not present

## 2018-05-05 DIAGNOSIS — M7541 Impingement syndrome of right shoulder: Secondary | ICD-10-CM | POA: Diagnosis not present

## 2018-05-05 DIAGNOSIS — Z9889 Other specified postprocedural states: Secondary | ICD-10-CM | POA: Diagnosis not present

## 2018-05-05 DIAGNOSIS — G8929 Other chronic pain: Secondary | ICD-10-CM | POA: Diagnosis not present

## 2018-05-05 DIAGNOSIS — M7542 Impingement syndrome of left shoulder: Secondary | ICD-10-CM | POA: Diagnosis not present

## 2018-05-09 DIAGNOSIS — M25512 Pain in left shoulder: Secondary | ICD-10-CM | POA: Insufficient documentation

## 2018-05-09 DIAGNOSIS — M7542 Impingement syndrome of left shoulder: Secondary | ICD-10-CM | POA: Diagnosis not present

## 2018-05-09 DIAGNOSIS — M7541 Impingement syndrome of right shoulder: Secondary | ICD-10-CM | POA: Diagnosis not present

## 2018-05-09 DIAGNOSIS — G8929 Other chronic pain: Secondary | ICD-10-CM | POA: Diagnosis not present

## 2018-06-28 DIAGNOSIS — M0579 Rheumatoid arthritis with rheumatoid factor of multiple sites without organ or systems involvement: Secondary | ICD-10-CM | POA: Diagnosis not present

## 2018-06-28 DIAGNOSIS — Z6833 Body mass index (BMI) 33.0-33.9, adult: Secondary | ICD-10-CM | POA: Diagnosis not present

## 2018-06-28 DIAGNOSIS — M2550 Pain in unspecified joint: Secondary | ICD-10-CM | POA: Diagnosis not present

## 2018-06-28 DIAGNOSIS — M150 Primary generalized (osteo)arthritis: Secondary | ICD-10-CM | POA: Diagnosis not present

## 2018-06-28 DIAGNOSIS — Z79899 Other long term (current) drug therapy: Secondary | ICD-10-CM | POA: Diagnosis not present

## 2018-06-28 DIAGNOSIS — E669 Obesity, unspecified: Secondary | ICD-10-CM | POA: Diagnosis not present

## 2018-06-29 DIAGNOSIS — G8929 Other chronic pain: Secondary | ICD-10-CM | POA: Diagnosis not present

## 2018-06-29 DIAGNOSIS — E782 Mixed hyperlipidemia: Secondary | ICD-10-CM | POA: Diagnosis not present

## 2018-06-29 DIAGNOSIS — M5442 Lumbago with sciatica, left side: Secondary | ICD-10-CM | POA: Diagnosis not present

## 2018-06-29 DIAGNOSIS — K219 Gastro-esophageal reflux disease without esophagitis: Secondary | ICD-10-CM | POA: Diagnosis not present

## 2018-06-29 DIAGNOSIS — I10 Essential (primary) hypertension: Secondary | ICD-10-CM | POA: Diagnosis not present

## 2018-06-29 DIAGNOSIS — E349 Endocrine disorder, unspecified: Secondary | ICD-10-CM | POA: Diagnosis not present

## 2018-06-29 DIAGNOSIS — M5441 Lumbago with sciatica, right side: Secondary | ICD-10-CM | POA: Diagnosis not present

## 2018-06-29 DIAGNOSIS — F419 Anxiety disorder, unspecified: Secondary | ICD-10-CM | POA: Diagnosis not present

## 2018-06-29 DIAGNOSIS — M2550 Pain in unspecified joint: Secondary | ICD-10-CM | POA: Diagnosis not present

## 2018-06-29 DIAGNOSIS — G2581 Restless legs syndrome: Secondary | ICD-10-CM | POA: Diagnosis not present

## 2018-06-29 DIAGNOSIS — E559 Vitamin D deficiency, unspecified: Secondary | ICD-10-CM | POA: Diagnosis not present

## 2018-07-01 DIAGNOSIS — H26492 Other secondary cataract, left eye: Secondary | ICD-10-CM | POA: Diagnosis not present

## 2018-07-05 DIAGNOSIS — M75121 Complete rotator cuff tear or rupture of right shoulder, not specified as traumatic: Secondary | ICD-10-CM | POA: Diagnosis not present

## 2018-07-05 DIAGNOSIS — M7541 Impingement syndrome of right shoulder: Secondary | ICD-10-CM | POA: Diagnosis not present

## 2018-07-05 DIAGNOSIS — G8929 Other chronic pain: Secondary | ICD-10-CM | POA: Diagnosis not present

## 2018-08-09 DIAGNOSIS — Z79899 Other long term (current) drug therapy: Secondary | ICD-10-CM | POA: Diagnosis not present

## 2018-08-09 DIAGNOSIS — E669 Obesity, unspecified: Secondary | ICD-10-CM | POA: Diagnosis not present

## 2018-08-09 DIAGNOSIS — M2550 Pain in unspecified joint: Secondary | ICD-10-CM | POA: Diagnosis not present

## 2018-08-09 DIAGNOSIS — M150 Primary generalized (osteo)arthritis: Secondary | ICD-10-CM | POA: Diagnosis not present

## 2018-08-09 DIAGNOSIS — Z6835 Body mass index (BMI) 35.0-35.9, adult: Secondary | ICD-10-CM | POA: Diagnosis not present

## 2018-08-09 DIAGNOSIS — M0579 Rheumatoid arthritis with rheumatoid factor of multiple sites without organ or systems involvement: Secondary | ICD-10-CM | POA: Diagnosis not present

## 2018-08-11 DIAGNOSIS — M7541 Impingement syndrome of right shoulder: Secondary | ICD-10-CM | POA: Diagnosis not present

## 2018-08-11 DIAGNOSIS — S46011A Strain of muscle(s) and tendon(s) of the rotator cuff of right shoulder, initial encounter: Secondary | ICD-10-CM | POA: Diagnosis not present

## 2018-08-11 DIAGNOSIS — M19011 Primary osteoarthritis, right shoulder: Secondary | ICD-10-CM | POA: Diagnosis not present

## 2018-08-16 DIAGNOSIS — Z9889 Other specified postprocedural states: Secondary | ICD-10-CM | POA: Insufficient documentation

## 2018-09-28 DIAGNOSIS — F5104 Psychophysiologic insomnia: Secondary | ICD-10-CM | POA: Diagnosis not present

## 2018-09-28 DIAGNOSIS — F419 Anxiety disorder, unspecified: Secondary | ICD-10-CM | POA: Diagnosis not present

## 2018-09-28 DIAGNOSIS — F3342 Major depressive disorder, recurrent, in full remission: Secondary | ICD-10-CM | POA: Diagnosis not present

## 2018-09-28 DIAGNOSIS — E669 Obesity, unspecified: Secondary | ICD-10-CM | POA: Diagnosis not present

## 2018-09-28 DIAGNOSIS — I10 Essential (primary) hypertension: Secondary | ICD-10-CM | POA: Diagnosis not present

## 2018-09-28 DIAGNOSIS — K210 Gastro-esophageal reflux disease with esophagitis: Secondary | ICD-10-CM | POA: Diagnosis not present

## 2018-09-28 DIAGNOSIS — M2550 Pain in unspecified joint: Secondary | ICD-10-CM | POA: Diagnosis not present

## 2018-09-28 DIAGNOSIS — E782 Mixed hyperlipidemia: Secondary | ICD-10-CM | POA: Diagnosis not present

## 2018-11-08 DIAGNOSIS — Z79899 Other long term (current) drug therapy: Secondary | ICD-10-CM | POA: Diagnosis not present

## 2018-11-08 DIAGNOSIS — M150 Primary generalized (osteo)arthritis: Secondary | ICD-10-CM | POA: Diagnosis not present

## 2018-11-08 DIAGNOSIS — M0579 Rheumatoid arthritis with rheumatoid factor of multiple sites without organ or systems involvement: Secondary | ICD-10-CM | POA: Diagnosis not present

## 2018-11-08 DIAGNOSIS — M2550 Pain in unspecified joint: Secondary | ICD-10-CM | POA: Diagnosis not present

## 2018-12-30 DIAGNOSIS — M2550 Pain in unspecified joint: Secondary | ICD-10-CM | POA: Diagnosis not present

## 2018-12-30 DIAGNOSIS — M150 Primary generalized (osteo)arthritis: Secondary | ICD-10-CM | POA: Diagnosis not present

## 2018-12-30 DIAGNOSIS — M0579 Rheumatoid arthritis with rheumatoid factor of multiple sites without organ or systems involvement: Secondary | ICD-10-CM | POA: Diagnosis not present

## 2018-12-30 DIAGNOSIS — E669 Obesity, unspecified: Secondary | ICD-10-CM | POA: Diagnosis not present

## 2018-12-30 DIAGNOSIS — Z79899 Other long term (current) drug therapy: Secondary | ICD-10-CM | POA: Diagnosis not present

## 2018-12-30 DIAGNOSIS — Z6838 Body mass index (BMI) 38.0-38.9, adult: Secondary | ICD-10-CM | POA: Diagnosis not present

## 2019-01-16 DIAGNOSIS — L237 Allergic contact dermatitis due to plants, except food: Secondary | ICD-10-CM | POA: Diagnosis not present

## 2019-01-16 DIAGNOSIS — L578 Other skin changes due to chronic exposure to nonionizing radiation: Secondary | ICD-10-CM | POA: Diagnosis not present

## 2019-02-15 ENCOUNTER — Ambulatory Visit: Payer: Medicare Other | Admitting: Sports Medicine

## 2019-02-17 ENCOUNTER — Ambulatory Visit (INDEPENDENT_AMBULATORY_CARE_PROVIDER_SITE_OTHER): Payer: Medicare Other | Admitting: Sports Medicine

## 2019-02-17 ENCOUNTER — Encounter: Payer: Self-pay | Admitting: Sports Medicine

## 2019-02-17 ENCOUNTER — Other Ambulatory Visit: Payer: Self-pay

## 2019-02-17 ENCOUNTER — Other Ambulatory Visit: Payer: Self-pay | Admitting: Sports Medicine

## 2019-02-17 DIAGNOSIS — M05772 Rheumatoid arthritis with rheumatoid factor of left ankle and foot without organ or systems involvement: Secondary | ICD-10-CM

## 2019-02-17 DIAGNOSIS — M2142 Flat foot [pes planus] (acquired), left foot: Secondary | ICD-10-CM

## 2019-02-17 DIAGNOSIS — M05771 Rheumatoid arthritis with rheumatoid factor of right ankle and foot without organ or systems involvement: Secondary | ICD-10-CM

## 2019-02-17 DIAGNOSIS — M79672 Pain in left foot: Secondary | ICD-10-CM

## 2019-02-17 DIAGNOSIS — M79671 Pain in right foot: Secondary | ICD-10-CM

## 2019-02-17 DIAGNOSIS — M722 Plantar fascial fibromatosis: Secondary | ICD-10-CM | POA: Diagnosis not present

## 2019-02-17 DIAGNOSIS — M2141 Flat foot [pes planus] (acquired), right foot: Secondary | ICD-10-CM

## 2019-02-17 NOTE — Progress Notes (Signed)
Subjective: Jordan Kim is a 48 y.o. female patient returns to office for new orthotics.  Patient reports that there is no changes with her foot problems reports that she is doing good with her orthotics reports that as long as she has new wounds to her feet feel good occasionally has some numbness and tingling but otherwise very minimal pain in her arches and heel of both feet.  Denies any other pedal complaints.   Patient Active Problem List   Diagnosis Date Noted  . HNP (herniated nucleus pulposus), lumbar 12/05/2014  . Spinal stenosis at L4-L5 level 12/05/2014  . Syncope and collapse   . Syncope 10/15/2014    Current Outpatient Medications on File Prior to Visit  Medication Sig Dispense Refill  . acetaminophen (TYLENOL) 650 MG CR tablet Take 650 mg by mouth every 8 (eight) hours as needed for pain.    Marland Kitchen. albuterol (PROVENTIL HFA;VENTOLIN HFA) 108 (90 BASE) MCG/ACT inhaler Inhale 1-2 puffs into the lungs every 4 (four) hours as needed for wheezing or shortness of breath.    Marland Kitchen. aspirin EC 81 MG tablet Take by mouth.    . cholecalciferol (VITAMIN D) 1000 UNITS tablet Take 5,000 Units by mouth daily.    . Cyanocobalamin (VITAMIN B 12 PO) Take 1 each by mouth every morning. Sublingual.    . cyclobenzaprine (FLEXERIL) 10 MG tablet Take 10 mg by mouth every 8 (eight) hours as needed.  3  . desogestrel-ethinyl estradiol (KARIVA,AZURETTE,MIRCETTE) 0.15-0.02/0.01 MG (21/5) tablet Take 1 tablet by mouth daily.  11  . diazepam (VALIUM) 5 MG tablet Take 1 tablet (5 mg total) by mouth nightly as needed for Anxiety.  2  . docusate sodium (COLACE) 100 MG capsule Take 1 capsule (100 mg total) by mouth 2 (two) times daily as needed for mild constipation. 20 capsule 1  . meclizine (ANTIVERT) 25 MG tablet Take 1 tablet (25 mg total) by mouth 3 (three) times daily as needed (vertigo). 30 tablet 0  . Melatonin 5 MG TABS Take 5 mg by mouth as needed.    . meloxicam (MOBIC) 15 MG tablet Take 1 tablet (15 mg  total) by mouth every morning. May resume 5 days post-op    . methocarbamol (ROBAXIN) 500 MG tablet Take 1 tablet (500 mg total) by mouth 3 (three) times daily. 40 tablet 1  . omeprazole (PRILOSEC) 20 MG capsule Take 20 mg by mouth daily at 12 noon.    Marland Kitchen. oxyCODONE-acetaminophen (PERCOCET) 5-325 MG per tablet Take 1 tablet by mouth every 4 (four) hours as needed. 60 tablet 0  . pravastatin (PRAVACHOL) 20 MG tablet Take 20 mg by mouth every evening.    . Probiotic Product (PRO-BIOTIC BLEND PO) Take 1 tablet by mouth every morning.     . psyllium (REGULOID) 0.52 G capsule Take 1.04 g by mouth every other day.    . traZODone (DESYREL) 50 MG tablet TAKE 1/2 TO 1 TABLET BY MOUTH NIGHTLY AS NEEDED FOR SLEEP  0  . Turmeric 400 MG CAPS Take by mouth.     No current facility-administered medications on file prior to visit.     Allergies  Allergen Reactions  . Morphine And Related Anaphylaxis and Rash    Tolerates hydromorphone.  . Demerol [Meperidine]     Cant remember  . Pregabalin Swelling  . Latex Rash    Objective: Physical Exam General: The patient is alert and oriented x3 in no acute distress.  Dermatology: Skin is warm, dry and supple  bilateral lower extremities. Nails 1-10 are within normal limits there is mild dry skin at heels, improved from prior.  There is no erythema, edema, no eccymosis, no open lesions present. Integument is otherwise unremarkable.  Vascular: Dorsalis Pedis pulse and Posterior Tibial pulse are 2/4 bilateral. Capillary fill time is immediate to all digits.  Neurological: Grossly intact to light touch with an achilles reflex of +2/5 and a  negative Tinel's sign bilateral.  Protective sensation severely diminished right greater than left contrary to history of radiculopathy.  Musculoskeletal: No tenderness to palpation at the medial calcaneal tubercale and through the insertion of the plantar fascia on the right greater than left foot. No pain with compression of  calcaneus bilateral. No pain with tuning fork to calcaneus bilateral. No pain with calf compression bilateral. There is decreased Ankle joint range of motion limited bilateral. All other joints range of motion within normal limits bilateral.  Positive pes planus deformity.  Strength 5/5 in all groups bilateral.   Assessment and Plan: Problem List Items Addressed This Visit    None    Visit Diagnoses    Plantar fasciitis, bilateral    -  Primary   Pes planus of both feet       Rheumatoid arthritis involving both feet with positive rheumatoid factor (HCC)       Foot pain, bilateral          -Complete examination performed.  -Patient declined next xrays  -Re-Discussed with patient in detail the condition of plantar fasciitis pes planus radiculopathy neuropathy and callus -Prescribed again custom molded foot orthotics since patient has done well with that in the past for foot pain.  Patient was casted and molded today orthotic prescription sent to [facility] lab -Recommend continue with daily stretching for history of fasciitis tight calf muscles and history of spasming -Patient to return to office pick up orthotics or sooner if problems or questions arise.  Landis Martins, DPM

## 2019-03-09 DIAGNOSIS — Z23 Encounter for immunization: Secondary | ICD-10-CM | POA: Diagnosis not present

## 2019-03-09 DIAGNOSIS — I10 Essential (primary) hypertension: Secondary | ICD-10-CM | POA: Diagnosis not present

## 2019-03-09 DIAGNOSIS — M2550 Pain in unspecified joint: Secondary | ICD-10-CM | POA: Diagnosis not present

## 2019-03-09 DIAGNOSIS — Z0000 Encounter for general adult medical examination without abnormal findings: Secondary | ICD-10-CM | POA: Diagnosis not present

## 2019-03-09 DIAGNOSIS — G2581 Restless legs syndrome: Secondary | ICD-10-CM | POA: Diagnosis not present

## 2019-03-09 DIAGNOSIS — Z1211 Encounter for screening for malignant neoplasm of colon: Secondary | ICD-10-CM | POA: Diagnosis not present

## 2019-03-09 DIAGNOSIS — E782 Mixed hyperlipidemia: Secondary | ICD-10-CM | POA: Diagnosis not present

## 2019-03-09 DIAGNOSIS — K210 Gastro-esophageal reflux disease with esophagitis: Secondary | ICD-10-CM | POA: Diagnosis not present

## 2019-03-21 DIAGNOSIS — M25512 Pain in left shoulder: Secondary | ICD-10-CM | POA: Diagnosis not present

## 2019-03-21 DIAGNOSIS — G8929 Other chronic pain: Secondary | ICD-10-CM | POA: Diagnosis not present

## 2019-04-03 DIAGNOSIS — M2550 Pain in unspecified joint: Secondary | ICD-10-CM | POA: Diagnosis not present

## 2019-04-03 DIAGNOSIS — Z79899 Other long term (current) drug therapy: Secondary | ICD-10-CM | POA: Diagnosis not present

## 2019-04-03 DIAGNOSIS — M0579 Rheumatoid arthritis with rheumatoid factor of multiple sites without organ or systems involvement: Secondary | ICD-10-CM | POA: Diagnosis not present

## 2019-04-03 DIAGNOSIS — Z6838 Body mass index (BMI) 38.0-38.9, adult: Secondary | ICD-10-CM | POA: Diagnosis not present

## 2019-04-03 DIAGNOSIS — E669 Obesity, unspecified: Secondary | ICD-10-CM | POA: Diagnosis not present

## 2019-04-03 DIAGNOSIS — M150 Primary generalized (osteo)arthritis: Secondary | ICD-10-CM | POA: Diagnosis not present

## 2019-04-05 DIAGNOSIS — K219 Gastro-esophageal reflux disease without esophagitis: Secondary | ICD-10-CM | POA: Diagnosis not present

## 2019-04-05 DIAGNOSIS — K5900 Constipation, unspecified: Secondary | ICD-10-CM | POA: Diagnosis not present

## 2019-04-13 DIAGNOSIS — M25512 Pain in left shoulder: Secondary | ICD-10-CM | POA: Diagnosis not present

## 2019-04-13 DIAGNOSIS — G8929 Other chronic pain: Secondary | ICD-10-CM | POA: Diagnosis not present

## 2019-06-27 DIAGNOSIS — M25512 Pain in left shoulder: Secondary | ICD-10-CM | POA: Diagnosis not present

## 2019-06-27 DIAGNOSIS — M62522 Muscle wasting and atrophy, not elsewhere classified, left upper arm: Secondary | ICD-10-CM | POA: Diagnosis not present

## 2019-06-27 DIAGNOSIS — M4004 Postural kyphosis, thoracic region: Secondary | ICD-10-CM | POA: Diagnosis not present

## 2019-06-29 DIAGNOSIS — M62522 Muscle wasting and atrophy, not elsewhere classified, left upper arm: Secondary | ICD-10-CM | POA: Diagnosis not present

## 2019-06-29 DIAGNOSIS — M25512 Pain in left shoulder: Secondary | ICD-10-CM | POA: Diagnosis not present

## 2019-06-29 DIAGNOSIS — M4004 Postural kyphosis, thoracic region: Secondary | ICD-10-CM | POA: Diagnosis not present

## 2019-07-04 DIAGNOSIS — Z79899 Other long term (current) drug therapy: Secondary | ICD-10-CM | POA: Diagnosis not present

## 2019-07-04 DIAGNOSIS — M0579 Rheumatoid arthritis with rheumatoid factor of multiple sites without organ or systems involvement: Secondary | ICD-10-CM | POA: Diagnosis not present

## 2019-07-04 DIAGNOSIS — M2550 Pain in unspecified joint: Secondary | ICD-10-CM | POA: Diagnosis not present

## 2019-07-04 DIAGNOSIS — M150 Primary generalized (osteo)arthritis: Secondary | ICD-10-CM | POA: Diagnosis not present

## 2019-07-17 DIAGNOSIS — M62522 Muscle wasting and atrophy, not elsewhere classified, left upper arm: Secondary | ICD-10-CM | POA: Diagnosis not present

## 2019-07-17 DIAGNOSIS — I10 Essential (primary) hypertension: Secondary | ICD-10-CM | POA: Diagnosis not present

## 2019-07-17 DIAGNOSIS — M25512 Pain in left shoulder: Secondary | ICD-10-CM | POA: Diagnosis not present

## 2019-07-17 DIAGNOSIS — E782 Mixed hyperlipidemia: Secondary | ICD-10-CM | POA: Diagnosis not present

## 2019-07-17 DIAGNOSIS — M4004 Postural kyphosis, thoracic region: Secondary | ICD-10-CM | POA: Diagnosis not present

## 2019-07-17 DIAGNOSIS — R5381 Other malaise: Secondary | ICD-10-CM | POA: Diagnosis not present

## 2019-07-17 DIAGNOSIS — R5383 Other fatigue: Secondary | ICD-10-CM | POA: Diagnosis not present

## 2019-08-21 DIAGNOSIS — R252 Cramp and spasm: Secondary | ICD-10-CM | POA: Diagnosis not present

## 2019-08-21 DIAGNOSIS — F3342 Major depressive disorder, recurrent, in full remission: Secondary | ICD-10-CM | POA: Diagnosis not present

## 2019-08-21 DIAGNOSIS — E669 Obesity, unspecified: Secondary | ICD-10-CM | POA: Diagnosis not present

## 2019-08-21 DIAGNOSIS — I10 Essential (primary) hypertension: Secondary | ICD-10-CM | POA: Diagnosis not present

## 2019-08-21 DIAGNOSIS — K2100 Gastro-esophageal reflux disease with esophagitis, without bleeding: Secondary | ICD-10-CM | POA: Diagnosis not present

## 2019-08-21 DIAGNOSIS — E782 Mixed hyperlipidemia: Secondary | ICD-10-CM | POA: Diagnosis not present

## 2019-08-21 DIAGNOSIS — G2581 Restless legs syndrome: Secondary | ICD-10-CM | POA: Diagnosis not present

## 2019-08-29 DIAGNOSIS — Z96612 Presence of left artificial shoulder joint: Secondary | ICD-10-CM | POA: Diagnosis not present

## 2019-08-29 DIAGNOSIS — M25512 Pain in left shoulder: Secondary | ICD-10-CM | POA: Diagnosis not present

## 2019-09-12 DIAGNOSIS — U071 COVID-19: Secondary | ICD-10-CM | POA: Diagnosis not present

## 2019-09-12 DIAGNOSIS — J019 Acute sinusitis, unspecified: Secondary | ICD-10-CM | POA: Diagnosis not present

## 2020-05-21 DIAGNOSIS — K219 Gastro-esophageal reflux disease without esophagitis: Secondary | ICD-10-CM | POA: Diagnosis not present

## 2020-05-21 DIAGNOSIS — F3342 Major depressive disorder, recurrent, in full remission: Secondary | ICD-10-CM | POA: Diagnosis not present

## 2020-05-21 DIAGNOSIS — Z0184 Encounter for antibody response examination: Secondary | ICD-10-CM | POA: Insufficient documentation

## 2020-05-21 DIAGNOSIS — Z0000 Encounter for general adult medical examination without abnormal findings: Secondary | ICD-10-CM | POA: Diagnosis not present

## 2020-05-21 DIAGNOSIS — E782 Mixed hyperlipidemia: Secondary | ICD-10-CM | POA: Diagnosis not present

## 2020-05-21 DIAGNOSIS — G2581 Restless legs syndrome: Secondary | ICD-10-CM | POA: Diagnosis not present

## 2020-05-21 DIAGNOSIS — I10 Essential (primary) hypertension: Secondary | ICD-10-CM | POA: Diagnosis not present

## 2020-06-07 ENCOUNTER — Other Ambulatory Visit: Payer: Self-pay

## 2020-06-07 ENCOUNTER — Ambulatory Visit (INDEPENDENT_AMBULATORY_CARE_PROVIDER_SITE_OTHER): Payer: Medicare Other | Admitting: Sports Medicine

## 2020-06-07 ENCOUNTER — Encounter: Payer: Self-pay | Admitting: Sports Medicine

## 2020-06-07 DIAGNOSIS — M2142 Flat foot [pes planus] (acquired), left foot: Secondary | ICD-10-CM

## 2020-06-07 DIAGNOSIS — M05771 Rheumatoid arthritis with rheumatoid factor of right ankle and foot without organ or systems involvement: Secondary | ICD-10-CM | POA: Diagnosis not present

## 2020-06-07 DIAGNOSIS — M79672 Pain in left foot: Secondary | ICD-10-CM

## 2020-06-07 DIAGNOSIS — M05772 Rheumatoid arthritis with rheumatoid factor of left ankle and foot without organ or systems involvement: Secondary | ICD-10-CM

## 2020-06-07 DIAGNOSIS — L84 Corns and callosities: Secondary | ICD-10-CM

## 2020-06-07 DIAGNOSIS — M79671 Pain in right foot: Secondary | ICD-10-CM

## 2020-06-07 DIAGNOSIS — M2141 Flat foot [pes planus] (acquired), right foot: Secondary | ICD-10-CM

## 2020-06-07 DIAGNOSIS — M722 Plantar fascial fibromatosis: Secondary | ICD-10-CM | POA: Diagnosis not present

## 2020-06-07 NOTE — Patient Instructions (Signed)
Vinegar soaks 1 cup of white distilled vinegar to 8 cups of warm water.  Soak 20 mins. May repeat soak two times per week.  If there is thickness to nails may file nails after soaks or after bath/shower with nail file and apply tea tree oil. Apply oil daily to nails after filing for the best result.  

## 2020-06-07 NOTE — Progress Notes (Signed)
Subjective: Jordan Kim is a 49 y.o. female patient returns to office for new orthotics.  Patient reports that her orthotics are making her walk to the outside and she has been rubbing and her medial heel causing callus.  Patient reports that these calluses get tender and has been trying Vaseline and skin creams and complete improvement.  Denies any other pedal complaints at this time.  Patient Active Problem List   Diagnosis Date Noted  . HNP (herniated nucleus pulposus), lumbar 12/05/2014  . Spinal stenosis at L4-L5 level 12/05/2014  . Syncope and collapse   . Syncope 10/15/2014    Current Outpatient Medications on File Prior to Visit  Medication Sig Dispense Refill  . Adalimumab (HUMIRA PEN) 40 MG/0.4ML PNKT     . ezetimibe (ZETIA) 10 MG tablet Take by mouth.    . fluconazole (DIFLUCAN) 150 MG tablet Take 1 tablet by mouth.  Repeat in 72 hours as needed    . folic acid (FOLVITE) 1 MG tablet Take by mouth.    Marland Kitchen HYDROcodone-acetaminophen (NORCO/VICODIN) 5-325 MG tablet Take by mouth.    . hydrOXYzine (ATARAX/VISTARIL) 25 MG tablet Take 1 tablet by mouth every 8 (eight) hours as needed.    . methotrexate 250 MG/10ML injection Patient is currently taking 0.8 mL    . triamcinolone acetonide (TRIESENCE) 40 MG/ML SUSP Inject into the muscle.    . triamcinolone cream (KENALOG) 0.5 % Apply to affected area twice daily as needed    . acetaminophen (TYLENOL) 650 MG CR tablet Take 650 mg by mouth every 8 (eight) hours as needed for pain.    Marland Kitchen albuterol (PROVENTIL HFA;VENTOLIN HFA) 108 (90 BASE) MCG/ACT inhaler Inhale 1-2 puffs into the lungs every 4 (four) hours as needed for wheezing or shortness of breath.    Marland Kitchen ascorbic acid (VITAMIN C) 1000 MG tablet Take by mouth.    Marland Kitchen aspirin EC 81 MG tablet Take by mouth.    . cholecalciferol (VITAMIN D) 1000 UNITS tablet Take 5,000 Units by mouth daily.    . Cyanocobalamin (VITAMIN B 12 PO) Take 1 each by mouth every morning. Sublingual.    .  cyclobenzaprine (FLEXERIL) 10 MG tablet Take 10 mg by mouth every 8 (eight) hours as needed.  3  . desogestrel-ethinyl estradiol (KARIVA,AZURETTE,MIRCETTE) 0.15-0.02/0.01 MG (21/5) tablet Take 1 tablet by mouth daily.  11  . diazepam (VALIUM) 5 MG tablet Take 1 tablet (5 mg total) by mouth nightly as needed for Anxiety.  2  . docusate sodium (COLACE) 100 MG capsule Take 1 capsule (100 mg total) by mouth 2 (two) times daily as needed for mild constipation. 20 capsule 1  . levofloxacin (LEVAQUIN) 500 MG tablet Take 500 mg by mouth daily.    . meclizine (ANTIVERT) 25 MG tablet Take 1 tablet (25 mg total) by mouth 3 (three) times daily as needed (vertigo). 30 tablet 0  . Melatonin 5 MG TABS Take 5 mg by mouth as needed.    . meloxicam (MOBIC) 15 MG tablet Take 1 tablet (15 mg total) by mouth every morning. May resume 5 days post-op    . methocarbamol (ROBAXIN) 500 MG tablet Take 1 tablet (500 mg total) by mouth 3 (three) times daily. 40 tablet 1  . Multiple Vitamin (MULTIVITAMIN) capsule Take 1 capsule by mouth daily.    Marland Kitchen omeprazole (PRILOSEC) 20 MG capsule Take 20 mg by mouth daily at 12 noon.    Marland Kitchen oxyCODONE-acetaminophen (PERCOCET) 5-325 MG per tablet Take 1 tablet by  mouth every 4 (four) hours as needed. 60 tablet 0  . phentermine 37.5 MG capsule Take by mouth.    . Potassium Gluconate 2.5 MEQ TABS Take 1 tablet by mouth at bedtime.    . pramipexole (MIRAPEX) 1 MG tablet Take by mouth.    . pravastatin (PRAVACHOL) 20 MG tablet Take 20 mg by mouth every evening.    . Probiotic Product (PRO-BIOTIC BLEND PO) Take 1 tablet by mouth every morning.     . psyllium (REGULOID) 0.52 G capsule Take 1.04 g by mouth every other day.    . traZODone (DESYREL) 50 MG tablet TAKE 1/2 TO 1 TABLET BY MOUTH NIGHTLY AS NEEDED FOR SLEEP  0  . Turmeric 400 MG CAPS Take by mouth.     No current facility-administered medications on file prior to visit.    Allergies  Allergen Reactions  . Morphine And Related  Anaphylaxis and Rash    Tolerates hydromorphone.  . Demerol [Meperidine]     Cant remember  . Pregabalin Swelling  . Latex Rash    Objective: Physical Exam General: The patient is alert and oriented x3 in no acute distress.  Dermatology: Skin is warm, dry and supple bilateral lower extremities. Nails 1-10 are within normal limits there is mild dry skin at heels, with reactive keratosis noted medial bilateral.  There is no erythema, edema, no eccymosis, no open lesions present. Integument is otherwise unremarkable.  Vascular: Dorsalis Pedis pulse and Posterior Tibial pulse are 2/4 bilateral. Capillary fill time is immediate to all digits.  Neurological: Grossly intact to light touch bilateral however there is subjective tingling and burning right greater than left history of radiculopathy.  Musculoskeletal: No tenderness to palpation at the medial calcaneal tubercale and through the insertion of the plantar fascia on the right greater than left foot however there is tenderness to callus area at medial heels bilateral. No pain with compression of calcaneus bilateral. No pain with tuning fork to calcaneus bilateral. No pain with calf compression bilateral. There is decreased Ankle joint range of motion limited bilateral. All other joints range of motion within normal limits bilateral.  Positive pes planus deformity.  Strength 5/5 in all groups bilateral.   Assessment and Plan: Problem List Items Addressed This Visit   None   Visit Diagnoses    Plantar fasciitis, bilateral    -  Primary   Pes planus of both feet       Rheumatoid arthritis involving both feet with positive rheumatoid factor (HCC)       Relevant Medications   Adalimumab (HUMIRA PEN) 40 MG/0.4ML PNKT   HYDROcodone-acetaminophen (NORCO/VICODIN) 5-325 MG tablet   methotrexate 250 MG/10ML injection   Foot pain, bilateral       Callus          -Complete examination performed.  -Re-Discussed with patient in detail the  condition of plantar fasciitis with orthotics that are hurting and callus -Advised patient to see Raiford Noble for orthotic adjustment to see if we can accommodate and make some changes to help prevent her from supinating and getting rubbing around her heel and her orthotics -Dispensed foot miracle cream for patient to use at callus areas.  At no additional charge mechanically did radiate keratosis using 15 blade without incident. -Advised patient to continue soaking and tea tree oil as needed for coloration to nails however at this time do not see any concerning discoloration -Recommend continue with daily stretching to avoid recurrence of plantar fasciitis -Patient to return to  office Rick for orthotic adjustment or sooner if problems or questions arise.  Asencion Islam, DPM

## 2020-06-27 DIAGNOSIS — Z23 Encounter for immunization: Secondary | ICD-10-CM | POA: Diagnosis not present

## 2020-07-03 ENCOUNTER — Ambulatory Visit (INDEPENDENT_AMBULATORY_CARE_PROVIDER_SITE_OTHER): Payer: Medicare Other | Admitting: Orthotics

## 2020-07-03 ENCOUNTER — Other Ambulatory Visit: Payer: Self-pay

## 2020-07-03 DIAGNOSIS — M722 Plantar fascial fibromatosis: Secondary | ICD-10-CM | POA: Diagnosis not present

## 2020-07-03 DIAGNOSIS — M05771 Rheumatoid arthritis with rheumatoid factor of right ankle and foot without organ or systems involvement: Secondary | ICD-10-CM

## 2020-07-03 DIAGNOSIS — M79671 Pain in right foot: Secondary | ICD-10-CM

## 2020-07-03 DIAGNOSIS — M79672 Pain in left foot: Secondary | ICD-10-CM

## 2020-07-03 DIAGNOSIS — L84 Corns and callosities: Secondary | ICD-10-CM

## 2020-07-03 DIAGNOSIS — M2141 Flat foot [pes planus] (acquired), right foot: Secondary | ICD-10-CM

## 2020-07-03 DIAGNOSIS — M2142 Flat foot [pes planus] (acquired), left foot: Secondary | ICD-10-CM

## 2020-07-03 DIAGNOSIS — M05772 Rheumatoid arthritis with rheumatoid factor of left ankle and foot without organ or systems involvement: Secondary | ICD-10-CM

## 2020-07-03 NOTE — Progress Notes (Signed)
1) adding valgus RF post b/l and 2) adding p-cell to heel  Also cast for new f/o

## 2020-07-18 ENCOUNTER — Telehealth: Payer: Self-pay | Admitting: Sports Medicine

## 2020-07-18 DIAGNOSIS — Z23 Encounter for immunization: Secondary | ICD-10-CM | POA: Diagnosis not present

## 2020-07-18 NOTE — Telephone Encounter (Signed)
Pt left message this morning stating she was in the area and wanted to know if her orthotics were ready. She had one new pair and a pr that Raiford Noble was to make adjustments to.  I called [employer] and the new pair is in production but they did not have a pair to be adjusted. Raiford Noble is going to research it and let her  know.

## 2020-07-19 DIAGNOSIS — M0579 Rheumatoid arthritis with rheumatoid factor of multiple sites without organ or systems involvement: Secondary | ICD-10-CM | POA: Diagnosis not present

## 2020-07-19 DIAGNOSIS — Z79899 Other long term (current) drug therapy: Secondary | ICD-10-CM | POA: Diagnosis not present

## 2020-07-30 DIAGNOSIS — N951 Menopausal and female climacteric states: Secondary | ICD-10-CM | POA: Diagnosis not present

## 2020-07-30 DIAGNOSIS — R7309 Other abnormal glucose: Secondary | ICD-10-CM | POA: Diagnosis not present

## 2020-07-30 DIAGNOSIS — J0100 Acute maxillary sinusitis, unspecified: Secondary | ICD-10-CM | POA: Diagnosis not present

## 2020-07-30 DIAGNOSIS — E782 Mixed hyperlipidemia: Secondary | ICD-10-CM | POA: Diagnosis not present

## 2020-07-30 DIAGNOSIS — Z9889 Other specified postprocedural states: Secondary | ICD-10-CM | POA: Diagnosis not present

## 2020-07-30 DIAGNOSIS — M5412 Radiculopathy, cervical region: Secondary | ICD-10-CM | POA: Diagnosis not present

## 2020-07-30 DIAGNOSIS — K2100 Gastro-esophageal reflux disease with esophagitis, without bleeding: Secondary | ICD-10-CM | POA: Diagnosis not present

## 2020-07-30 DIAGNOSIS — I10 Essential (primary) hypertension: Secondary | ICD-10-CM | POA: Diagnosis not present

## 2020-08-20 DIAGNOSIS — F0781 Postconcussional syndrome: Secondary | ICD-10-CM | POA: Diagnosis not present

## 2020-08-20 DIAGNOSIS — H43393 Other vitreous opacities, bilateral: Secondary | ICD-10-CM | POA: Diagnosis not present

## 2022-11-02 ENCOUNTER — Encounter (HOSPITAL_COMMUNITY): Payer: Self-pay | Admitting: Emergency Medicine

## 2022-11-02 ENCOUNTER — Emergency Department (HOSPITAL_COMMUNITY): Payer: Commercial Managed Care - PPO

## 2022-11-02 ENCOUNTER — Other Ambulatory Visit: Payer: Self-pay

## 2022-11-02 ENCOUNTER — Emergency Department (HOSPITAL_COMMUNITY)
Admission: EM | Admit: 2022-11-02 | Discharge: 2022-11-03 | Disposition: A | Discharge status: Home / Self Care | Payer: Commercial Managed Care - PPO | Attending: Emergency Medicine | Admitting: Emergency Medicine

## 2022-11-02 DIAGNOSIS — W010XXA Fall on same level from slipping, tripping and stumbling without subsequent striking against object, initial encounter: Secondary | ICD-10-CM | POA: Diagnosis not present

## 2022-11-02 DIAGNOSIS — Z7982 Long term (current) use of aspirin: Secondary | ICD-10-CM | POA: Insufficient documentation

## 2022-11-02 DIAGNOSIS — M79604 Pain in right leg: Secondary | ICD-10-CM | POA: Insufficient documentation

## 2022-11-02 DIAGNOSIS — I10 Essential (primary) hypertension: Secondary | ICD-10-CM | POA: Insufficient documentation

## 2022-11-02 DIAGNOSIS — Z79899 Other long term (current) drug therapy: Secondary | ICD-10-CM | POA: Insufficient documentation

## 2022-11-02 DIAGNOSIS — Z91040 Latex allergy status: Secondary | ICD-10-CM | POA: Diagnosis not present

## 2022-11-02 DIAGNOSIS — Y9301 Activity, walking, marching and hiking: Secondary | ICD-10-CM | POA: Diagnosis not present

## 2022-11-02 DIAGNOSIS — W19XXXA Unspecified fall, initial encounter: Secondary | ICD-10-CM

## 2022-11-02 MED ORDER — MELOXICAM 15 MG PO TABS: 15.0000 mg | ORAL_TABLET | Freq: Every day | ORAL | 0 refills | Status: AC

## 2022-11-02 MED ORDER — HYDROMORPHONE HCL 1 MG/ML IJ SOLN
1.0000 mg | Freq: Once | INTRAMUSCULAR | Status: AC
  Administered 2022-11-02: 1 mg via INTRAVENOUS
  Filled 2022-11-02 (×2): qty 1

## 2022-11-02 MED ORDER — HYDROCODONE-ACETAMINOPHEN 5-325 MG PO TABS: 1.0000 | ORAL_TABLET | ORAL | 0 refills | Status: AC | PRN

## 2022-11-02 MED ORDER — KETOROLAC TROMETHAMINE 15 MG/ML IJ SOLN: 15.0000 mg | Freq: Once | INTRAMUSCULAR | Status: DC

## 2022-11-02 NOTE — Discharge Instructions (Addendum)
You were evaluated today after a fall. Your x-rays show no fracture or dislocation. Please use the ace wrap to maintain compression over the right leg. Use crutches to reduce weight bearing on the right lower extremity. I have prescribed pain medication to be taken as needed. Do not exceed 4,000mg  of acetaminophen from all sources in a 24 hour period. Do not take other NSAID medications while taking meloxicam.  Please follow up with orthopedics for further evaluation and management.

## 2022-11-02 NOTE — ED Notes (Signed)
Patient refused crutches, stating she has a walker at home.

## 2022-11-02 NOTE — ED Triage Notes (Signed)
Pt bib [facility] from home. Pt had ground level fall. Pt c/o 10/10 RLE pain,Pt reports decreased sensation. Pt also c/o R wrist pain. +Pedal Pulse. fentanyl given iv route

## 2022-11-02 NOTE — ED Provider Notes (Cosign Needed Addendum)
[facility] AT [facility] Provider Note   CSN: [id number] Arrival date & time: 11/02/22  2051     History  Chief Complaint  Patient presents with   Marletta Lor    Jordan Kim is a 52 y.o. female.  Patient presents to the emergency department via EMS complaining of right lower extremity pain.  Patient states that she slipped and fell while walking from inside her house to outside, landing in a "split".  She endorses feeling a pop in the right leg at that time.  She complains of 10 out of 10 right lower extremity pain.  Patient also complains of some mild right-sided wrist pain.  EMS administered 100 mcg of fentanyl during transport.  Patient denies hitting her head and denies losing consciousness.  Patient with history significant for chronic pain of both knees, chronic back pain, spinal stenosis at the L4-L5 level.,  Herniated nucleus pulposus, lumbar, hypertension  HPI     Home Medications Prior to Admission medications   Medication Sig Start Date End Date Taking? Authorizing Provider  HYDROcodone-acetaminophen (NORCO/VICODIN) 5-325 MG tablet Take 1 tablet by mouth every 4 (four) hours as needed. 11/02/22  Yes Darrick Grinder, PA-C  meloxicam (MOBIC) 15 MG tablet Take 1 tablet (15 mg total) by mouth daily. 11/02/22 12/02/22 Yes Darrick Grinder, PA-C  acetaminophen (TYLENOL) 650 MG CR tablet Take 650 mg by mouth every 8 (eight) hours as needed for pain.    [provider]  Adalimumab (HUMIRA PEN) 40 MG/0.4ML PNKT  10/23/19   [provider]  albuterol (PROVENTIL HFA;VENTOLIN HFA) 108 (90 BASE) MCG/ACT inhaler Inhale 1-2 puffs into the lungs every 4 (four) hours as needed for wheezing or shortness of breath.    [provider]  ascorbic acid (VITAMIN C) 1000 MG tablet Take by mouth.    [provider]  aspirin EC 81 MG tablet Take by mouth.    [provider]  cholecalciferol (VITAMIN D) 1000 UNITS tablet Take 5,000  Units by mouth daily.    [provider]  Cyanocobalamin (VITAMIN B 12 PO) Take 1 each by mouth every morning. Sublingual.    [provider]  cyclobenzaprine (FLEXERIL) 10 MG tablet Take 10 mg by mouth every 8 (eight) hours as needed. 05/18/17   [provider]  desogestrel-ethinyl estradiol (KARIVA,AZURETTE,MIRCETTE) 0.15-0.02/0.01 MG (21/5) tablet Take 1 tablet by mouth daily. 08/05/17   [provider]  diazepam (VALIUM) 5 MG tablet Take 1 tablet (5 mg total) by mouth nightly as needed for Anxiety. 07/29/17   [provider]  docusate sodium (COLACE) 100 MG capsule Take 1 capsule (100 mg total) by mouth 2 (two) times daily as needed for mild constipation. 12/05/14   Jene Every, MD  ezetimibe (ZETIA) 10 MG tablet Take by mouth. 05/21/20   [provider]  fluconazole (DIFLUCAN) 150 MG tablet Take 1 tablet by mouth.  Repeat in 72 hours as needed 09/12/19   [provider]  folic acid (FOLVITE) 1 MG tablet Take by mouth. 06/17/18   [provider]  hydrOXYzine (ATARAX/VISTARIL) 25 MG tablet Take 1 tablet by mouth every 8 (eight) hours as needed. 02/20/19   [provider]  levofloxacin (LEVAQUIN) 500 MG tablet Take 500 mg by mouth daily. 03/11/20   [provider]  meclizine (ANTIVERT) 25 MG tablet Take 1 tablet (25 mg total) by mouth 3 (three) times daily as needed (vertigo). 10/16/14   Christiane Ha, MD  Melatonin 5 MG TABS Take 5 mg by mouth as needed.    [provider]  methocarbamol (ROBAXIN) 500 MG tablet Take 1 tablet (500 mg total) by mouth 3 (three) times daily. 12/05/14   Jene Every, MD  methotrexate 250 MG/10ML injection Patient is currently taking 0.8 mL 05/09/18   [provider]  Multiple Vitamin (MULTIVITAMIN) capsule Take 1 capsule by mouth daily.    [provider]  omeprazole (PRILOSEC) 20 MG capsule Take 20 mg by mouth daily at 12 noon.    [provider]  phentermine 37.5 MG capsule Take by mouth.    [provider]  Potassium Gluconate 2.5 MEQ TABS Take 1 tablet by mouth at bedtime.    [provider]  pramipexole (MIRAPEX) 1 MG tablet Take by mouth.    [provider]  pravastatin (PRAVACHOL) 20 MG tablet Take 20 mg by mouth every evening.    [provider]  Probiotic Product (PRO-BIOTIC BLEND PO) Take 1 tablet by mouth every morning.     [provider]  psyllium (REGULOID) 0.52 G capsule Take 1.04 g by mouth every other day.    [provider]  traZODone (DESYREL) 50 MG tablet TAKE 1/2 TO 1 TABLET BY MOUTH NIGHTLY AS NEEDED FOR SLEEP 05/27/17   [provider]  triamcinolone acetonide (TRIESENCE) 40 MG/ML SUSP Inject into the muscle. 09/24/17   [provider]  triamcinolone cream (KENALOG) 0.5 % Apply to affected area twice daily as needed 07/17/19   [provider]  Turmeric 400 MG CAPS Take by mouth.    [provider]      Allergies    Morphine and related, Demerol [meperidine], Pregabalin, and Latex    Review of Systems   Review of Systems  Physical Exam Updated Vital Signs BP 138/69   Pulse 75   Temp 97.8 F (36.6 C) (Oral)   Resp 15   Ht 5\' 6"  (1.676 m)   Wt 89.8 kg   SpO2 97%   BMI 31.96 kg/m  Physical Exam Vitals and nursing note reviewed.  Constitutional:      General: She is not in acute distress.    Appearance: She is well-developed.  HENT:     Head: Normocephalic and atraumatic.  Eyes:     Conjunctiva/sclera: Conjunctivae normal.  Cardiovascular:     Rate and Rhythm: Normal rate and regular rhythm.     Heart sounds: No murmur heard. Pulmonary:     Effort: Pulmonary effort is normal. No respiratory distress.     Breath sounds: Normal breath sounds.  Abdominal:     Palpations: Abdomen is soft.     Tenderness: There is no abdominal tenderness.  Musculoskeletal:        General: Tenderness and signs of  injury present. No swelling or deformity.     Cervical back: Neck supple.     Comments: Tenderness to palpation of the right anterior and posterior thigh.  No bruising appreciated.  Patient complains of pain with very minimal passive range of motion at the right hip and knee.  Strong pedal pulse appreciated.  No swelling appreciated.  Skin:    General: Skin is warm and dry.     Capillary Refill: Capillary refill takes less than 2 seconds.  Neurological:     Mental Status: She is alert.  Psychiatric:        Mood and Affect: Mood normal.     ED Results / Procedures / Treatments  Labs (all labs ordered are listed, but only abnormal results are displayed) Labs Reviewed - No data to display  EKG None  Radiology DG Knee Complete 4 Views Right  Result Date: 11/02/2022 CLINICAL DATA:  Mid upper leg pain.  Right wrist pain. EXAM: RIGHT KNEE - COMPLETE 4+ VIEW COMPARISON:  MRI right knee 01/28/2017 FINDINGS: Minimal medial compartment joint space narrowing and peripheral spurring. Mild patella alta. Mild inferior patellar degenerative osteophytosis. No joint effusion.  No acute fracture or dislocation. IMPRESSION: 1. Mild patella alta. 2. Mild patellofemoral and minimal medial compartment osteoarthritis. Electronically Signed   By: Neita Garnet M.D.   On: 11/02/2022 22:42   DG Wrist Complete Right  Result Date: 11/02/2022 CLINICAL DATA:  Right wrist pain. EXAM: RIGHT WRIST - COMPLETE 3+ VIEW COMPARISON:  None Available. FINDINGS: Neutral ulnar variance. There is a 5 mm ossicle just proximal to the triquetrum. Mild distal lateral scaphoid degenerative osteophytosis. Mild peripheral medial and lateral trapezium degenerative osteophytosis with minimal thumb carpometacarpal joint space narrowing. No acute fracture or dislocation. IMPRESSION: 1. Very mild thumb carpometacarpal and triscaphe osteoarthritis. 2. 5 mm ossicle just proximal to the triquetrum, likely the sequela of remote trauma.  Electronically Signed   By: Neita Garnet M.D.   On: 11/02/2022 22:14   DG Hip Unilat W or Wo Pelvis 2-3 Views Right  Result Date: 11/02/2022 CLINICAL DATA:  Pain. EXAM: DG HIP (WITH OR WITHOUT PELVIS) 2-3V RIGHT COMPARISON:  None Available. FINDINGS: There is no evidence of hip fracture or dislocation. There is no evidence of arthropathy or other focal bone abnormality. IMPRESSION: Negative. Electronically Signed   By: Darliss Cheney M.D.   On: 11/02/2022 22:07    Procedures .Ortho Injury Treatment  Date/Time: 11/02/2022 11:46 PM  Performed by: Darrick Grinder, PA-C Authorized by: Darrick Grinder, PA-C   Consent:    Consent obtained:  Verbal   Consent given by:  Patient   Risks discussed:  Vascular damage, stiffness and restricted joint movement   Alternatives discussed:  No treatmentInjury location: upper leg Location details: right upper leg Injury type: soft tissue Pre-procedure neurovascular assessment: neurovascularly intact Splint Applied by: ED Tech Supplies used: elastic bandage Post-procedure neurovascular assessment: post-procedure neurovascularly intact Comments: ACE wrap with crutches       Medications Ordered in ED Medications  ketorolac (TORADOL) 15 MG/ML injection 15 mg (has no administration in time range)  HYDROmorphone (DILAUDID) injection 1 mg (1 mg Intravenous Given 11/02/22 2119)    ED Course/ Medical Decision Making/ A&P                             Medical Decision Making Amount and/or Complexity of Data Reviewed Radiology: ordered.  Risk Prescription drug management.   This patient presents to the ED for concern of right lower extremity pain, this involves an extensive number of treatment options, and is a complaint that carries with it a high risk of complications and morbidity.  The differential diagnosis includes fracture, dislocation, soft tissue injury, others   Co morbidities that complicate the patient evaluation  Chronic hip and  knee pain   Additional history obtained:  Additional history obtained from EMS   Imaging Studies ordered:  I ordered imaging studies including plain films of the right hip, knee, and wrist I independently visualized and interpreted imaging which showed no acute abnormalities, fractures, dislocations I agree with the radiologist interpretation    Problem List / ED Course /  Critical interventions / Medication management   I ordered medication including Dilaudid for pain Reevaluation of the patient after these medicines showed that the patient improved I have reviewed the patients home medicines and have made adjustments as needed   Test / Admission - Considered:  Patient was able to ambulate with assistance. No fracture or dislocation noted on imaging. Injury appears to be likely soft tissue, possibly an injured tendon. Plan to discharge home with ace wrap, crutches, prescriptions for Meloxicam and hydrocodone, and follow up with orthopedics. Patient voices understanding with plan. Return precautions provided. Discharge home.          Final Clinical Impression(s) / ED Diagnoses Final diagnoses:  Fall, initial encounter  Right leg pain    Rx / DC Orders ED Discharge Orders          Ordered    meloxicam (MOBIC) 15 MG tablet  Daily        11/02/22 2341    HYDROcodone-acetaminophen (NORCO/VICODIN) 5-325 MG tablet  Every 4 hours PRN        11/02/22 2341              Darrick Grinder, PA-C 11/02/22 2344    Darrick Grinder, PA-C 11/02/22 2346    Gloris Manchester, MD 11/04/22 [phone]

## 2023-01-27 ENCOUNTER — Other Ambulatory Visit: Payer: Self-pay | Admitting: Orthopedic Surgery

## 2023-02-04 ENCOUNTER — Other Ambulatory Visit: Payer: Self-pay

## 2023-02-04 ENCOUNTER — Encounter (HOSPITAL_BASED_OUTPATIENT_CLINIC_OR_DEPARTMENT_OTHER): Payer: Self-pay | Admitting: Orthopedic Surgery

## 2023-02-08 ENCOUNTER — Encounter (HOSPITAL_BASED_OUTPATIENT_CLINIC_OR_DEPARTMENT_OTHER)
Admission: RE | Admit: 2023-02-08 | Discharge: 2023-02-08 | Disposition: A | Discharge status: Home / Self Care | Payer: Medicare Other | Source: Ambulatory Visit | Attending: Orthopedic Surgery | Admitting: Orthopedic Surgery

## 2023-02-08 DIAGNOSIS — Z01812 Encounter for preprocedural laboratory examination: Secondary | ICD-10-CM | POA: Diagnosis present

## 2023-02-08 DIAGNOSIS — Z01810 Encounter for preprocedural cardiovascular examination: Secondary | ICD-10-CM | POA: Insufficient documentation

## 2023-02-08 DIAGNOSIS — I499 Cardiac arrhythmia, unspecified: Secondary | ICD-10-CM | POA: Diagnosis not present

## 2023-02-08 NOTE — Progress Notes (Signed)

## 2023-02-11 ENCOUNTER — Encounter (HOSPITAL_BASED_OUTPATIENT_CLINIC_OR_DEPARTMENT_OTHER): Payer: Self-pay | Admitting: Orthopedic Surgery

## 2023-02-11 ENCOUNTER — Ambulatory Visit (HOSPITAL_BASED_OUTPATIENT_CLINIC_OR_DEPARTMENT_OTHER): Payer: Commercial Managed Care - PPO | Admitting: Anesthesiology

## 2023-02-11 ENCOUNTER — Encounter (HOSPITAL_BASED_OUTPATIENT_CLINIC_OR_DEPARTMENT_OTHER)
Admission: RE | Disposition: A | Discharge status: Home / Self Care | Payer: Self-pay | Source: Home / Self Care | Attending: Orthopedic Surgery

## 2023-02-11 ENCOUNTER — Ambulatory Visit (HOSPITAL_BASED_OUTPATIENT_CLINIC_OR_DEPARTMENT_OTHER)
Admission: RE | Admit: 2023-02-11 | Discharge: 2023-02-11 | Disposition: A | Discharge status: Home / Self Care | Payer: Commercial Managed Care - PPO | Attending: Orthopedic Surgery | Admitting: Orthopedic Surgery

## 2023-02-11 ENCOUNTER — Other Ambulatory Visit: Payer: Self-pay

## 2023-02-11 DIAGNOSIS — W19XXXA Unspecified fall, initial encounter: Secondary | ICD-10-CM | POA: Insufficient documentation

## 2023-02-11 DIAGNOSIS — M1990 Unspecified osteoarthritis, unspecified site: Secondary | ICD-10-CM | POA: Insufficient documentation

## 2023-02-11 DIAGNOSIS — I10 Essential (primary) hypertension: Secondary | ICD-10-CM | POA: Insufficient documentation

## 2023-02-11 DIAGNOSIS — J45909 Unspecified asthma, uncomplicated: Secondary | ICD-10-CM | POA: Insufficient documentation

## 2023-02-11 DIAGNOSIS — I499 Cardiac arrhythmia, unspecified: Secondary | ICD-10-CM

## 2023-02-11 DIAGNOSIS — F32A Depression, unspecified: Secondary | ICD-10-CM | POA: Diagnosis not present

## 2023-02-11 DIAGNOSIS — S63652A Sprain of metacarpophalangeal joint of right middle finger, initial encounter: Secondary | ICD-10-CM | POA: Diagnosis present

## 2023-02-11 DIAGNOSIS — F419 Anxiety disorder, unspecified: Secondary | ICD-10-CM | POA: Diagnosis not present

## 2023-02-11 DIAGNOSIS — K219 Gastro-esophageal reflux disease without esophagitis: Secondary | ICD-10-CM | POA: Insufficient documentation

## 2023-02-11 DIAGNOSIS — Z87891 Personal history of nicotine dependence: Secondary | ICD-10-CM | POA: Insufficient documentation

## 2023-02-11 DIAGNOSIS — S63412A Traumatic rupture of collateral ligament of right middle finger at metacarpophalangeal and interphalangeal joint, initial encounter: Secondary | ICD-10-CM

## 2023-02-11 HISTORY — PX: ULNAR COLLATERAL LIGAMENT REPAIR: SHX6159

## 2023-02-11 HISTORY — DX: Essential (primary) hypertension: I10

## 2023-02-11 LAB — POCT PREGNANCY, URINE: Preg Test, Ur: NEGATIVE

## 2023-02-11 SURGERY — REPAIR, LIGAMENT, ULNAR COLLATERAL
Anesthesia: Monitor Anesthesia Care | Site: Hand | Laterality: Right

## 2023-02-11 MED ORDER — OXYCODONE HCL 5 MG PO TABS
ORAL_TABLET | ORAL | Status: AC
  Filled 2023-02-11: qty 1

## 2023-02-11 MED ORDER — ACETAMINOPHEN 10 MG/ML IV SOLN: 1000.0000 mg | Freq: Once | INTRAVENOUS | Status: DC | PRN

## 2023-02-11 MED ORDER — LIDOCAINE 2% (20 MG/ML) 5 ML SYRINGE
INTRAMUSCULAR | Status: DC | PRN
  Administered 2023-02-11: 50 mg via INTRAVENOUS

## 2023-02-11 MED ORDER — FENTANYL CITRATE (PF) 100 MCG/2ML IJ SOLN
INTRAMUSCULAR | Status: DC | PRN
  Administered 2023-02-11: 50 ug via INTRAVENOUS

## 2023-02-11 MED ORDER — MIDAZOLAM HCL 2 MG/2ML IJ SOLN
2.0000 mg | Freq: Once | INTRAMUSCULAR | Status: AC
  Administered 2023-02-11: 2 mg via INTRAVENOUS

## 2023-02-11 MED ORDER — LACTATED RINGERS IV SOLN: INTRAVENOUS | Status: DC

## 2023-02-11 MED ORDER — MIDAZOLAM HCL 2 MG/2ML IJ SOLN
INTRAMUSCULAR | Status: AC
  Filled 2023-02-11: qty 2

## 2023-02-11 MED ORDER — LIDOCAINE HCL (PF) 1 % IJ SOLN
INTRAMUSCULAR | Status: AC
  Filled 2023-02-11: qty 30

## 2023-02-11 MED ORDER — DEXAMETHASONE SODIUM PHOSPHATE 10 MG/ML IJ SOLN
INTRAMUSCULAR | Status: AC
  Filled 2023-02-11: qty 1

## 2023-02-11 MED ORDER — FENTANYL CITRATE (PF) 100 MCG/2ML IJ SOLN: 25.0000 ug | INTRAMUSCULAR | Status: DC | PRN

## 2023-02-11 MED ORDER — CEFAZOLIN SODIUM-DEXTROSE 2-4 GM/100ML-% IV SOLN
2.0000 g | INTRAVENOUS | Status: AC
  Administered 2023-02-11: 2 g via INTRAVENOUS

## 2023-02-11 MED ORDER — FENTANYL CITRATE (PF) 100 MCG/2ML IJ SOLN
INTRAMUSCULAR | Status: AC
  Filled 2023-02-11: qty 2

## 2023-02-11 MED ORDER — CEFAZOLIN SODIUM-DEXTROSE 2-4 GM/100ML-% IV SOLN
INTRAVENOUS | Status: AC
  Filled 2023-02-11: qty 100

## 2023-02-11 MED ORDER — EPHEDRINE 5 MG/ML INJ
INTRAVENOUS | Status: AC
  Filled 2023-02-11: qty 5

## 2023-02-11 MED ORDER — SUCCINYLCHOLINE CHLORIDE 200 MG/10ML IV SOSY
PREFILLED_SYRINGE | INTRAVENOUS | Status: AC
  Filled 2023-02-11: qty 10

## 2023-02-11 MED ORDER — ONDANSETRON HCL 4 MG/2ML IJ SOLN
INTRAMUSCULAR | Status: DC | PRN
  Administered 2023-02-11: 4 mg via INTRAVENOUS

## 2023-02-11 MED ORDER — FENTANYL CITRATE (PF) 100 MCG/2ML IJ SOLN
100.0000 ug | Freq: Once | INTRAMUSCULAR | Status: AC
  Administered 2023-02-11: 100 ug via INTRAVENOUS

## 2023-02-11 MED ORDER — ONDANSETRON HCL 4 MG/2ML IJ SOLN: 4.0000 mg | Freq: Once | INTRAMUSCULAR | Status: DC | PRN

## 2023-02-11 MED ORDER — OXYCODONE HCL 5 MG PO TABS
5.0000 mg | ORAL_TABLET | Freq: Once | ORAL | Status: AC | PRN
  Administered 2023-02-11: 5 mg via ORAL

## 2023-02-11 MED ORDER — DEXAMETHASONE SODIUM PHOSPHATE 10 MG/ML IJ SOLN
INTRAMUSCULAR | Status: DC | PRN
  Administered 2023-02-11: 10 mg

## 2023-02-11 MED ORDER — ATROPINE SULFATE 0.4 MG/ML IV SOLN
INTRAVENOUS | Status: AC
  Filled 2023-02-11: qty 1

## 2023-02-11 MED ORDER — PROPOFOL 500 MG/50ML IV EMUL
INTRAVENOUS | Status: DC | PRN
  Administered 2023-02-11: 125 ug/kg/min via INTRAVENOUS

## 2023-02-11 MED ORDER — 0.9 % SODIUM CHLORIDE (POUR BTL) OPTIME
TOPICAL | Status: DC | PRN
  Administered 2023-02-11: 200 mL

## 2023-02-11 MED ORDER — MIDAZOLAM HCL 5 MG/5ML IJ SOLN
INTRAMUSCULAR | Status: DC | PRN
  Administered 2023-02-11: 1 mg via INTRAVENOUS

## 2023-02-11 MED ORDER — LIDOCAINE 2% (20 MG/ML) 5 ML SYRINGE
INTRAMUSCULAR | Status: AC
  Filled 2023-02-11: qty 5

## 2023-02-11 MED ORDER — ROPIVACAINE HCL 5 MG/ML IJ SOLN
INTRAMUSCULAR | Status: DC | PRN
  Administered 2023-02-11: 30 mL via EPIDURAL

## 2023-02-11 MED ORDER — OXYCODONE HCL 5 MG/5ML PO SOLN: 5.0000 mg | Freq: Once | ORAL | Status: AC | PRN

## 2023-02-11 MED ORDER — HYDROCODONE-ACETAMINOPHEN 5-325 MG PO TABS: ORAL_TABLET | ORAL | 0 refills | Status: AC

## 2023-02-11 MED ORDER — PHENYLEPHRINE 80 MCG/ML (10ML) SYRINGE FOR IV PUSH (FOR BLOOD PRESSURE SUPPORT)
PREFILLED_SYRINGE | INTRAVENOUS | Status: AC
  Filled 2023-02-11: qty 10

## 2023-02-11 MED ORDER — ONDANSETRON HCL 4 MG/2ML IJ SOLN
INTRAMUSCULAR | Status: AC
  Filled 2023-02-11: qty 2

## 2023-02-11 SURGICAL SUPPLY — 76 items
ANCH SUT 2-0 MN NDL DRL PLSTR (Anchor) ×1 IMPLANT
ANCHOR JUGGERKNOT 1.0 1DR 2-0 (Anchor) IMPLANT
APL PRP STRL LF DISP 70% ISPRP (MISCELLANEOUS) ×1
BLADE MINI RND TIP GREEN BEAV (BLADE) ×1 IMPLANT
BLADE SURG 15 STRL LF DISP TIS (BLADE) ×2 IMPLANT
BLADE SURG 15 STRL SS (BLADE) ×2
BNDG CMPR 5X2 KNTD ELC UNQ LF (GAUZE/BANDAGES/DRESSINGS)
BNDG CMPR 5X3 KNIT ELC UNQ LF (GAUZE/BANDAGES/DRESSINGS)
BNDG CMPR 9X4 STRL LF SNTH (GAUZE/BANDAGES/DRESSINGS) ×1
BNDG ELASTIC 2INX 5YD STR LF (GAUZE/BANDAGES/DRESSINGS) IMPLANT
BNDG ELASTIC 3INX 5YD STR LF (GAUZE/BANDAGES/DRESSINGS) IMPLANT
BNDG ESMARK 4X9 LF (GAUZE/BANDAGES/DRESSINGS) ×1 IMPLANT
BNDG GAUZE DERMACEA FLUFF 4 (GAUZE/BANDAGES/DRESSINGS) ×1 IMPLANT
BNDG GZE DERMACEA 4 6PLY (GAUZE/BANDAGES/DRESSINGS) ×1
CATH ROBINSON RED A/P 8FR (CATHETERS) IMPLANT
CHLORAPREP W/TINT 26 (MISCELLANEOUS) ×1 IMPLANT
CORD BIPOLAR FORCEPS 12FT (ELECTRODE) ×1 IMPLANT
COVER BACK TABLE 60X90IN (DRAPES) ×1 IMPLANT
COVER MAYO STAND STRL (DRAPES) ×1 IMPLANT
CUFF TOURN SGL QUICK 18X4 (TOURNIQUET CUFF) ×1 IMPLANT
DRAPE EXTREMITY T 121X128X90 (DISPOSABLE) ×1 IMPLANT
DRAPE OEC MINIVIEW 54X84 (DRAPES) IMPLANT
DRAPE SURG 17X23 STRL (DRAPES) ×1 IMPLANT
GAUZE PAD ABD 8X10 STRL (GAUZE/BANDAGES/DRESSINGS) IMPLANT
GAUZE SPONGE 4X4 12PLY STRL (GAUZE/BANDAGES/DRESSINGS) ×1 IMPLANT
GAUZE XEROFORM 1X8 LF (GAUZE/BANDAGES/DRESSINGS) ×1 IMPLANT
GLOVE BIO SURGEON STRL SZ7.5 (GLOVE) ×1 IMPLANT
GLOVE BIOGEL PI IND STRL 8 (GLOVE) ×1 IMPLANT
GLOVE BIOGEL PI IND STRL 8.5 (GLOVE) ×1 IMPLANT
GLOVE SURG ORTHO 8.0 STRL STRW (GLOVE) IMPLANT
GLOVE SURG SS PI 7.5 STRL IVOR (GLOVE) IMPLANT
GOWN STRL REUS W/ TWL LRG LVL3 (GOWN DISPOSABLE) ×1 IMPLANT
GOWN STRL REUS W/TWL LRG LVL3 (GOWN DISPOSABLE) ×1
GOWN STRL REUS W/TWL XL LVL3 (GOWN DISPOSABLE) ×1 IMPLANT
K-WIRE DBL .035X4 NSTRL (WIRE)
KWIRE DBL .035X4 NSTRL (WIRE) IMPLANT
NDL HYPO 22X1.5 SAFETY MO (MISCELLANEOUS) IMPLANT
NDL HYPO 25X1 1.5 SAFETY (NEEDLE) IMPLANT
NDL KEITH (NEEDLE) IMPLANT
NDL SAFETY ECLIP 18X1.5 (MISCELLANEOUS) IMPLANT
NEEDLE HYPO 22X1.5 SAFETY MO (MISCELLANEOUS)
NEEDLE HYPO 25X1 1.5 SAFETY (NEEDLE)
NEEDLE KEITH (NEEDLE)
NS IRRIG 1000ML POUR BTL (IV SOLUTION) ×1 IMPLANT
PACK BASIN DAY SURGERY FS (CUSTOM PROCEDURE TRAY) ×1 IMPLANT
PAD CAST 3X4 CTTN HI CHSV (CAST SUPPLIES) ×1 IMPLANT
PAD CAST 4YDX4 CTTN HI CHSV (CAST SUPPLIES) IMPLANT
PADDING CAST ABS COTTON 4X4 ST (CAST SUPPLIES) ×1 IMPLANT
PADDING CAST COTTON 3X4 STRL (CAST SUPPLIES) ×1
PADDING CAST COTTON 4X4 STRL (CAST SUPPLIES)
PASSER SUT SWANSON 36MM LOOP (INSTRUMENTS) IMPLANT
SLEEVE SCD COMPRESS KNEE MED (STOCKING) ×1 IMPLANT
SLING ARM FOAM STRAP LRG (SOFTGOODS) IMPLANT
SPIKE FLUID TRANSFER (MISCELLANEOUS) IMPLANT
SPLINT PLASTER CAST FAST 5X30 (CAST SUPPLIES) IMPLANT
SPLINT PLASTER CAST XFAST 3X15 (CAST SUPPLIES) IMPLANT
STOCKINETTE 4X48 STRL (DRAPES) ×1 IMPLANT
SUT CHROMIC 4 0 P 3 18 (SUTURE) IMPLANT
SUT ETHIBOND 3-0 V-5 (SUTURE) IMPLANT
SUT ETHILON 3 0 PS 1 (SUTURE) IMPLANT
SUT ETHILON 4 0 PS 2 18 (SUTURE) IMPLANT
SUT FIBERWIRE 2-0 18 17.9 3/8 (SUTURE)
SUT FIBERWIRE 3-0 18 TAPR NDL (SUTURE)
SUT MERSILENE 2.0 SH NDLE (SUTURE) IMPLANT
SUT MERSILENE 4 0 P 3 (SUTURE) IMPLANT
SUT SILK 4 0 PS 2 (SUTURE) IMPLANT
SUT VIC AB 2-0 SH 27 (SUTURE)
SUT VIC AB 2-0 SH 27XBRD (SUTURE) IMPLANT
SUT VIC AB 4-0 PS2 18 (SUTURE) IMPLANT
SUT VICRYL 0 SH 27 (SUTURE) IMPLANT
SUTURE FIBERWR 2-0 18 17.9 3/8 (SUTURE) IMPLANT
SUTURE FIBERWR 3-0 18 TAPR NDL (SUTURE) IMPLANT
SYR BULB EAR ULCER 3OZ GRN STR (SYRINGE) ×1 IMPLANT
SYR CONTROL 10ML LL (SYRINGE) IMPLANT
TOWEL GREEN STERILE FF (TOWEL DISPOSABLE) ×2 IMPLANT
UNDERPAD 30X36 HEAVY ABSORB (UNDERPADS AND DIAPERS) ×1 IMPLANT

## 2023-02-11 NOTE — H&P (Signed)
Jordan Kim is an 52 y.o. female.   Chief Complaint: collateral ligament tear HPI: 52 yo female states she fell on right hand 11/02/22 injuring right hand.  Has had continued pain at mp joint long finger.  MRI confirms tear of radial collateral ligament right long finger.  She wishes to proceed with operative repair of collateral ligament.  Allergies:  Allergies  Allergen Reactions   Morphine And Codeine Anaphylaxis and Rash    Tolerates hydromorphone.   Demerol [Meperidine]     Cant remember   Pregabalin Swelling   Latex Rash    Past Medical History:  Diagnosis Date   Anemia    hx   Arthritis    "feet knees, hips, back, right shoulder, neck, hands" (10/16/2014)   Asthma    Chronic back pain    Chronic bronchitis (HCC)    last episode 08/2014   Complication of anesthesia    "very hard for me to come out of it"   DDD (degenerative disc disease), cervical    "w/bulging & collapsed disc" (10/16/2014)   DDD (degenerative disc disease), lumbosacral    "herniations S1-S2" (10/16/2014)   GERD (gastroesophageal reflux disease)    High cholesterol    HTN (hypertension)    Kidney stones    Migraine    "1-2/yr now" (10/16/2014)   Pneumonia    "4-5 times; last time was early April 2016" (10/16/2014)   PONV (postoperative nausea and vomiting)    Shortness of breath dyspnea    humidity    Syncope and collapse 10/15/2014   Urinary tract infection    08/2014    Past Surgical History:  Procedure Laterality Date   BACK SURGERY     CARPAL TUNNEL RELEASE Right 2000's   CESAREAN SECTION  1985; 1995; 1998   GANGLION CYST EXCISION Left 2000's   KNEE ARTHROSCOPY Bilateral 1987; 1988; 1989   "both; both; both"   LUMBAR LAMINECTOMY/DECOMPRESSION MICRODISCECTOMY  2010   L4-5 right and L5-S1 left/notes 10/22/2010   LUMBAR LAMINECTOMY/DECOMPRESSION MICRODISCECTOMY Right 12/05/2014   Procedure: REVISION MICRO LUMBAR DECOMPRESSION, MICRODISCECTOMY L4-L5 RIGHT;  Surgeon: Jene Every, MD;   Location: [facility];  Service: Orthopedics;  Laterality: Right;   SHOULDER ARTHROSCOPY W/ ROTATOR CUFF REPAIR Right 12/2010    Family History: History reviewed. No pertinent family history.  Social History:   reports that she quit smoking about 16 years ago. Her smoking use included cigarettes. She started smoking about 49 years ago. She has a 49.5 pack-year smoking history. She has never used smokeless tobacco. She reports current alcohol use. She reports that she does not use drugs.  Medications: Medications Prior to Admission  Medication Sig Dispense Refill   acetaminophen (TYLENOL) 650 MG CR tablet Take 650 mg by mouth every 8 (eight) hours as needed for pain.     albuterol (PROVENTIL HFA;VENTOLIN HFA) 108 (90 BASE) MCG/ACT inhaler Inhale 1-2 puffs into the lungs every 4 (four) hours as needed for wheezing or shortness of breath.     cyclobenzaprine (FLEXERIL) 10 MG tablet Take 10 mg by mouth every 8 (eight) hours as needed.  3   diazepam (VALIUM) 5 MG tablet Take 1 tablet (5 mg total) by mouth nightly as needed for Anxiety.  2   docusate sodium (COLACE) 100 MG capsule Take 1 capsule (100 mg total) by mouth 2 (two) times daily as needed for mild constipation. 20 capsule 1   folic acid (FOLVITE) 1 MG tablet Take by mouth.     HYDROcodone-acetaminophen (NORCO/VICODIN)  5-325 MG tablet Take 1 tablet by mouth every 4 (four) hours as needed. 18 tablet 0   Melatonin 5 MG TABS Take 5 mg by mouth as needed.     methocarbamol (ROBAXIN) 500 MG tablet Take 1 tablet (500 mg total) by mouth 3 (three) times daily. 40 tablet 1   methotrexate 250 MG/10ML injection Patient is currently taking 0.8 mL     metoprolol tartrate (LOPRESSOR) 25 MG tablet Take 25 mg by mouth 2 (two) times daily.     Multiple Vitamin (MULTIVITAMIN) capsule Take 1 capsule by mouth daily.     omeprazole (PRILOSEC) 20 MG capsule Take 20 mg by mouth daily at 12 noon.     phentermine 37.5 MG capsule Take by mouth.     Potassium Gluconate  2.5 MEQ TABS Take 1 tablet by mouth at bedtime.     pramipexole (MIRAPEX) 1 MG tablet Take by mouth.     psyllium (REGULOID) 0.52 G capsule Take 1.04 g by mouth every other day.     Turmeric 400 MG CAPS Take by mouth.     Adalimumab (HUMIRA PEN) 40 MG/0.4ML PNKT      ascorbic acid (VITAMIN C) 1000 MG tablet Take by mouth.     aspirin EC 81 MG tablet Take by mouth.     cholecalciferol (VITAMIN D) 1000 UNITS tablet Take 5,000 Units by mouth daily.     Cyanocobalamin (VITAMIN B 12 PO) Take 1 each by mouth every morning. Sublingual.     desogestrel-ethinyl estradiol (KARIVA,AZURETTE,MIRCETTE) 0.15-0.02/0.01 MG (21/5) tablet Take 1 tablet by mouth daily.  11   ezetimibe (ZETIA) 10 MG tablet Take by mouth.     fluconazole (DIFLUCAN) 150 MG tablet Take 1 tablet by mouth.  Repeat in 72 hours as needed     hydrOXYzine (ATARAX/VISTARIL) 25 MG tablet Take 1 tablet by mouth every 8 (eight) hours as needed.     levofloxacin (LEVAQUIN) 500 MG tablet Take 500 mg by mouth daily.     meclizine (ANTIVERT) 25 MG tablet Take 1 tablet (25 mg total) by mouth 3 (three) times daily as needed (vertigo). 30 tablet 0   Probiotic Product (PRO-BIOTIC BLEND PO) Take 1 tablet by mouth every morning.      traZODone (DESYREL) 50 MG tablet TAKE 1/2 TO 1 TABLET BY MOUTH NIGHTLY AS NEEDED FOR SLEEP  0   triamcinolone acetonide (TRIESENCE) 40 MG/ML SUSP Inject into the muscle.     triamcinolone cream (KENALOG) 0.5 % Apply to affected area twice daily as needed      Results for orders placed or performed during the hospital encounter of 02/11/23 (from the past 48 hour(s))  Pregnancy, urine POC     Status: None   Collection Time: 02/11/23  7:19 AM  Result Value Ref Range   Preg Test, Ur NEGATIVE NEGATIVE    Comment:        THE SENSITIVITY OF THIS METHODOLOGY IS >24 mIU/mL     No results found.    Blood pressure 128/73, pulse 62, temperature (!) 97 F (36.1 C), temperature source Temporal, resp. rate 13, height 5\' 5"   (1.651 m), weight 99.7 kg, SpO2 99%.  General appearance: alert, cooperative, and appears stated age Head: Normocephalic, without obvious abnormality, atraumatic Neck: supple, symmetrical, trachea midline Extremities: Intact sensation and capillary refill all digits.  +epl/fpl/io.  No wounds. Tender at radial side long finger mp joint.  Laxity and pain with stressing radial collateral ligament. Pulses: 2+ and symmetric Skin: Skin color,  texture, turgor normal. No rashes or lesions Neurologic: Grossly normal Incision/Wound: none  Assessment/Plan Right long finger radial collateral ligament tear.  Non operative and operative treatment options have been discussed with the patient and patient wishes to proceed with operative treatment. Risks, benefits and alternatives of surgery were discussed including risks of blood loss, infection, damage to nerves/vessels/tendons/ligament/bone, failure of surgery, need for additional surgery, complication with wound healing, stiffness.   She voiced understanding of these risks and elected to proceed.    Betha Loa 02/11/2023, 9:22 AM

## 2023-02-11 NOTE — Progress Notes (Signed)
Assisted Dr.Buck with right, axillary, ultrasound guided block. Side rails up, monitors on throughout procedure. See vital signs in flow sheet. Tolerated Procedure well.

## 2023-02-11 NOTE — Anesthesia Procedure Notes (Signed)
Anesthesia Regional Block: Axillary brachial plexus block   Pre-Anesthetic Checklist: , timeout performed,  Correct Patient, Correct Site, Correct Laterality,  Correct Procedure, Correct Position, site marked,  Risks and benefits discussed,  Surgical consent,  Pre-op evaluation,  At surgeon's request and post-op pain management  Laterality: Right  Prep: Maximum Sterile Barrier Precautions used, chloraprep       Needles:  Injection technique: Single-shot  Needle Type: Echogenic Needle     Needle Length: 5cm  Needle Gauge: 22     Additional Needles:   Procedures:,,,, ultrasound used (permanent image in chart),,    Narrative:  Start time: 02/11/2023 9:10 AM End time: 02/11/2023 9:15 AM Injection made incrementally with aspirations every 5 mL.  Performed by: Personally  Anesthesiologist: Mariann Barter, MD

## 2023-02-11 NOTE — Op Note (Signed)
I assisted Surgeons and Role:    * Betha Loa, MD - Primary    * Cindee Salt, MD - Assisting on the Procedure(s): EXPLORATION/REPAIR RADIAL COLLATERAL LIGAMENT RIGHT MIDDLE FINGER METACARPAL PHALANGEAL JOINT on 02/11/2023.  I provided assistance on this case as follows: Set up, approach, retraction for identification of the extensor tendon joint and lateral ligament, opening of the joint, closure of the collateral ligament repair of the metacarpal head placement of an anchor repair of the collateral ligament closure of the tendon and wound and application of the dressing and splint.  Electronically signed by: Cindee Salt, MD Date: 02/11/2023 Time: 10:37 AM

## 2023-02-11 NOTE — Anesthesia Preprocedure Evaluation (Addendum)
Anesthesia Evaluation  Patient identified by MRN, date of birth, ID band Patient awake    Reviewed: Allergy & Precautions, NPO status , Patient's Chart, lab work & pertinent test results, reviewed documented beta blocker date and time   History of Anesthesia Complications (+) PONV, PROLONGED EMERGENCE and history of anesthetic complications  Airway Mallampati: I  TM Distance: >3 FB Neck ROM: Full    Dental no notable dental hx.    Pulmonary shortness of breath, asthma , resolved, neg COPD, former smoker   breath sounds clear to auscultation       Cardiovascular hypertension, (-) angina (-) CAD and (-) Past MI + dysrhythmias Supra Ventricular Tachycardia  Rhythm:Regular Rate:Normal     Neuro/Psych  Headaches, neg Seizures PSYCHIATRIC DISORDERS Anxiety Depression       GI/Hepatic ,GERD  ,,(+) neg Cirrhosis        Endo/Other    Renal/GU Renal disease (stones)     Musculoskeletal  (+) Arthritis ,    Abdominal   Peds  Hematology  (+) Blood dyscrasia, anemia   Anesthesia Other Findings   Reproductive/Obstetrics                              Anesthesia Physical Anesthesia Plan  ASA: 2  Anesthesia Plan: MAC and Regional   Post-op Pain Management:    Induction: Intravenous  PONV Risk Score and Plan: 1 and Propofol infusion  Airway Management Planned:   Additional Equipment:   Intra-op Plan:   Post-operative Plan:   Informed Consent: I have reviewed the patients History and Physical, chart, labs and discussed the procedure including the risks, benefits and alternatives for the proposed anesthesia with the patient or authorized representative who has indicated his/her understanding and acceptance.       Plan Discussed with: CRNA  Anesthesia Plan Comments:          Anesthesia Quick Evaluation

## 2023-02-11 NOTE — Anesthesia Postprocedure Evaluation (Signed)
Anesthesia Post Note  Patient: ALEJAH DEMARCHI  Procedure(s) Performed: EXPLORATION/REPAIR RADIAL COLLATERAL LIGAMENT RIGHT MIDDLE FINGER METACARPAL PHALANGEAL JOINT (Right: Hand)     Patient location during evaluation: PACU Anesthesia Type: Regional and MAC Level of consciousness: awake and alert Pain management: pain level controlled Vital Signs Assessment: post-procedure vital signs reviewed and stable Respiratory status: spontaneous breathing, nonlabored ventilation, respiratory function stable and patient connected to nasal cannula oxygen Cardiovascular status: stable and blood pressure returned to baseline Postop Assessment: no apparent nausea or vomiting Anesthetic complications: no   No notable events documented.  Last Vitals:  Vitals:   02/11/23 1115 02/11/23 1134  BP: 123/71 (!) 140/69  Pulse: 61 61  Resp: 13 14  Temp:  (!) 36.1 C  SpO2: 95% 98%    Last Pain:  Vitals:   02/11/23 1148  TempSrc:   PainSc: 3                  Mariann Barter

## 2023-02-11 NOTE — Discharge Instructions (Addendum)
[facility] Instructions Hand Surgery  Wound Care: Keep your hand elevated above the level of your heart.  Do not allow it to dangle by your side.  Keep the dressing dry and do not remove it unless your doctor advises you to do so.  He will usually change it at the time of your post-op visit.  Moving your fingers is advised to stimulate circulation but will depend on the site of your surgery.  If you have a splint applied, your doctor will advise you regarding movement.  Activity: Do not drive or operate machinery today.  Rest today and then you may return to your normal activity and work as indicated by your physician.  Diet:  Drink liquids today or eat a light diet.  You may resume a regular diet tomorrow.    General expectations: Pain for two to three days. Fingers may become slightly swollen.  Call your doctor if any of the following occur: Severe pain not relieved by pain medication. Elevated temperature. Dressing soaked with blood. Inability to move fingers. White or bluish color to fingers.    Post Anesthesia Home Care Instructions  Activity: Get plenty of rest for the remainder of the day. A responsible individual must stay with you for 24 hours following the procedure.  For the next 24 hours, DO NOT: -Drive a car -Advertising copywriter -Drink alcoholic beverages -Take any medication unless instructed by your physician -Make any legal decisions or sign important papers.  Meals: Start with liquid foods such as gelatin or soup. Progress to regular foods as tolerated. Avoid greasy, spicy, heavy foods. If nausea and/or vomiting occur, drink only clear liquids until the nausea and/or vomiting subsides. Call your physician if vomiting continues.  Special Instructions/Symptoms: Your throat may feel dry or sore from the anesthesia or the breathing tube placed in your throat during surgery. If this causes discomfort, gargle with warm salt water. The discomfort should disappear  within 24 hours.  If you had a scopolamine patch placed behind your ear for the management of post- operative nausea and/or vomiting:  1. The medication in the patch is effective for 72 hours, after which it should be removed.  Wrap patch in a tissue and discard in the trash. Wash hands thoroughly with soap and water. 2. You may remove the patch earlier than 72 hours if you experience unpleasant side effects which may include dry mouth, dizziness or visual disturbances. 3. Avoid touching the patch. Wash your hands with soap and water after contact with the patch.    Regional Anesthesia Blocks  1. You may not be able to move or feel the "blocked" extremity after a regional anesthetic block. This may last may last from 3-48 hours after placement, but it will go away. The length of time depends on the medication injected and your individual response to the medication. As the nerves start to wake up, you may experience tingling as the movement and feeling returns to your extremity. If the numbness and inability to move your extremity has not gone away after 48 hours, please call your surgeon.   2. The extremity that is blocked will need to be protected until the numbness is gone and the strength has returned. Because you cannot feel it, you will need to take extra care to avoid injury. Because it may be weak, you may have difficulty moving it or using it. You may not know what position it is in without looking at it while the block is in effect.  3. For blocks in the legs and feet, returning to weight bearing and walking needs to be done carefully. You will need to wait until the numbness is entirely gone and the strength has returned. You should be able to move your leg and foot normally before you try and bear weight or walk. You will need someone to be with you when you first try to ensure you do not fall and possibly risk injury.  4. Bruising and tenderness at the needle site are common side  effects and will resolve in a few days.  5. Persistent numbness or new problems with movement should be communicated to the surgeon or the [facility] [phone] [facility] [phone]).      Oxycodone given at 11:50am

## 2023-02-11 NOTE — Transfer of Care (Signed)
Immediate Anesthesia Transfer of Care Note  Patient: Jordan Kim  Procedure(s) Performed: EXPLORATION/REPAIR RADIAL COLLATERAL LIGAMENT RIGHT MIDDLE FINGER METACARPAL PHALANGEAL JOINT (Right: Hand)  Patient Location: PACU  Anesthesia Type:MAC combined with regional for post-op pain  Level of Consciousness: sedated  Airway & Oxygen Therapy: Patient Spontanous Breathing and Patient connected to face mask oxygen  Post-op Assessment: Report given to RN and Post -op Vital signs reviewed and stable  Post vital signs: Reviewed and stable  Last Vitals:  Vitals Value Taken Time  BP    Temp    Pulse 71 02/11/23 1042  Resp    SpO2 97 % 02/11/23 1042  Vitals shown include unfiled device data.  Last Pain:  Vitals:   02/11/23 0735  TempSrc: Temporal  PainSc: 2       Patients Stated Pain Goal: 4 (02/11/23 0735)  Complications: No notable events documented.

## 2023-02-11 NOTE — Op Note (Signed)
NAME: Jordan Kim MEDICAL RECORD NO: [id number] DATE OF BIRTH: 01-Feb-1971 FACILITY: [facility] LOCATION: [facility] PHYSICIAN: Tami Ribas, MD   OPERATIVE REPORT   DATE OF PROCEDURE: 02/11/23    PREOPERATIVE DIAGNOSIS: Right long finger radial collateral ligament tear   POSTOPERATIVE DIAGNOSIS: Right long finger radial collateral ligament tear   PROCEDURE: Right long finger repair of radial collateral ligament at MP joint   SURGEON:  Betha Loa, M.D.   ASSISTANT: Cindee Salt, MD   ANESTHESIA:  Regional with sedation   INTRAVENOUS FLUIDS:  Per anesthesia flow sheet.   ESTIMATED BLOOD LOSS:  Minimal.   COMPLICATIONS:  None.   SPECIMENS:  none   TOURNIQUET TIME:    Total Tourniquet Time Documented: Upper Arm (Right) - 29 minutes Total: Upper Arm (Right) - 29 minutes    DISPOSITION:  Stable to PACU.   INDICATIONS: 52 year old female states she injured her right hand in a fall approximately 3 months ago.  MRI shows tear of the right long finger radial collateral ligament at the MP joint.  She wishes to proceed with operative repair.  Risks, benefits and alternatives of surgery were discussed including the risks of blood loss, infection, damage to nerves, vessels, tendons, ligaments, bone for surgery, need for additional surgery, complications with wound healing, continued pain, stiffness.  She voiced understanding of these risks and elected to proceed.  OPERATIVE COURSE:  After being identified preoperatively by myself,  the patient and I agreed on the procedure and site of the procedure.  The surgical site was marked.  Surgical consent had been signed. Preoperative IV antibiotic prophylaxis was given. She was transferred to the operating room and placed on the operating table in supine position with the Right upper extremity on an arm board.  Sedation was induced by the anesthesiologist. A regional block had been performed by anesthesia in preoperative  holding.    Right upper extremity was prepped and draped in normal sterile orthopedic fashion.  A surgical pause was performed between the surgeons, anesthesia, and operating room staff and all were in agreement as to the patient, procedure, and site of procedure.  Tourniquet at the proximal aspect of the extremity was inflated to 250 mmHg after exsanguination of the arm with an Esmarch bandage.  Incision was made at the dorsum of the MP joint of the long finger.  This was carried in subcutaneous tissues by spreading technique.  The radial sagittal band of the extensor mechanism was identified and sharply incised.  The capsule was incised and elevated.  The radial collateral ligament was identified.  It had ruptured from the origin at the metacarpal head.  This was completed with the [facility] blade.  Any degenerative tissue was removed with a rongeurs.  The insertion of the collateral ligament at the base of the proximal phalanx was strong.  The footprint of the collateral ligament was roughened with the house curette.  A juggernaut suture anchor was then placed into the footprint.  The origin of the collateral ligament was sutured down into the footprint.  There was good stability of the collateral ligament testing after repair.  The wound was copiously irrigated with sterile saline.  The capsule was repaired with a 4-0 chromic in a running fashion.  The sagittal band was repaired with a running 4-0 Mersilene suture.  Skin was closed with 4-0 nylon in a horizontal mattress fashion.  The wound was dressed with sterile Xeroform 4 x 4's and wrapped with a Kerlix bandage.  Volar and dorsal slab splint including the index long ring and small fingers was placed with the MPs flexed and the IP's extended.  This was wrapped with Kerlix and Ace bandage.  The tourniquet was deflated at 29 minutes.  Fingertips were pink with brisk capillary refill after deflation of tourniquet.  The operative  drapes were broken down.  The  patient was awoken from anesthesia safely.  She was transferred back to the stretcher and taken to PACU in stable condition.  I will see her back in the office in 1 week for postoperative followup.  I will give her a prescription for Norco 5/325 1-2 tabs PO q6 hours prn pain, dispense # 20.   Betha Loa, MD Electronically signed, 02/11/23

## 2023-02-12 ENCOUNTER — Encounter (HOSPITAL_BASED_OUTPATIENT_CLINIC_OR_DEPARTMENT_OTHER): Payer: Self-pay | Admitting: Orthopedic Surgery
